# Patient Record
Sex: Male | Born: 1980 | Race: Black or African American | Hispanic: No | Marital: Single | Smoking: Current every day smoker
Health system: Southern US, Community
[De-identification: ages and names within clinical notes are randomized; demographics above are authoritative.]

## PROBLEM LIST (undated history)

## (undated) DIAGNOSIS — Y249XXA Unspecified firearm discharge, undetermined intent, initial encounter: Secondary | ICD-10-CM

## (undated) DIAGNOSIS — R040 Epistaxis: Secondary | ICD-10-CM

## (undated) HISTORY — PX: SHOULDER SURGERY: SHX246

## (undated) HISTORY — PX: HERNIA REPAIR: SHX51

---

## 2001-06-30 ENCOUNTER — Inpatient Hospital Stay (HOSPITAL_COMMUNITY): Admission: EM | Admit: 2001-06-30 | Discharge: 2001-07-01 | Payer: Self-pay | Admitting: Emergency Medicine

## 2002-12-17 ENCOUNTER — Emergency Department (HOSPITAL_COMMUNITY): Admission: EM | Admit: 2002-12-17 | Discharge: 2002-12-17 | Payer: Self-pay | Admitting: Emergency Medicine

## 2002-12-17 ENCOUNTER — Encounter: Payer: Self-pay | Admitting: Emergency Medicine

## 2005-12-03 ENCOUNTER — Emergency Department (HOSPITAL_COMMUNITY): Admission: EM | Admit: 2005-12-03 | Discharge: 2005-12-03 | Payer: Self-pay | Admitting: Emergency Medicine

## 2005-12-11 ENCOUNTER — Emergency Department (HOSPITAL_COMMUNITY): Admission: EM | Admit: 2005-12-11 | Discharge: 2005-12-11 | Payer: Self-pay | Admitting: Emergency Medicine

## 2008-03-14 ENCOUNTER — Emergency Department (HOSPITAL_COMMUNITY): Admission: EM | Admit: 2008-03-14 | Discharge: 2008-03-14 | Payer: Self-pay | Admitting: Emergency Medicine

## 2009-07-11 ENCOUNTER — Emergency Department (HOSPITAL_COMMUNITY): Admission: EM | Admit: 2009-07-11 | Discharge: 2009-07-11 | Payer: Self-pay | Admitting: Emergency Medicine

## 2009-08-10 ENCOUNTER — Emergency Department (HOSPITAL_COMMUNITY): Admission: EM | Admit: 2009-08-10 | Discharge: 2009-08-10 | Payer: Self-pay | Admitting: Emergency Medicine

## 2010-09-03 ENCOUNTER — Emergency Department (HOSPITAL_COMMUNITY)
Admission: EM | Admit: 2010-09-03 | Discharge: 2010-09-03 | Disposition: A | Discharge status: Home / Self Care | Payer: Self-pay | Attending: Emergency Medicine | Admitting: Emergency Medicine

## 2010-09-03 DIAGNOSIS — L298 Other pruritus: Secondary | ICD-10-CM | POA: Insufficient documentation

## 2010-09-03 DIAGNOSIS — B86 Scabies: Secondary | ICD-10-CM | POA: Insufficient documentation

## 2010-09-03 DIAGNOSIS — L2989 Other pruritus: Secondary | ICD-10-CM | POA: Insufficient documentation

## 2010-10-17 LAB — GC/CHLAMYDIA PROBE AMP, GENITAL
Chlamydia, DNA Probe: POSITIVE — AB
GC Probe Amp, Genital: NEGATIVE

## 2010-10-17 LAB — URINALYSIS, ROUTINE W REFLEX MICROSCOPIC
Hgb urine dipstick: NEGATIVE
Nitrite: NEGATIVE
Protein, ur: NEGATIVE mg/dL
Specific Gravity, Urine: 1.017 (ref 1.005–1.030)
Urobilinogen, UA: 0.2 mg/dL (ref 0.0–1.0)

## 2010-10-17 LAB — URINE MICROSCOPIC-ADD ON

## 2010-10-17 LAB — RPR: RPR Ser Ql: NONREACTIVE

## 2010-12-17 NOTE — Op Note (Signed)
[facility]  Patient:    MCKOY, BHAKTA Visit Number: [id number] MRN: [id number]          Service Type: MED Location: 1E 0102 01 Attending Physician:  Benny Lennert Dictated by:   Zigmund Daniel, M.D. Proc. Date: 06/30/01 Admit Date:  06/30/2001                             Operative Report  PREOPERATIVE DIAGNOSIS:  Incarcerated indirect left inguinal hernia.  POSTOPERATIVE DIAGNOSIS:  Incarcerated indirect left inguinal hernia.  OPERATION:  Repair of left inguinal hernia.  SURGEON:  Zigmund Daniel, M.D.  ANESTHESIA:  General.  DESCRIPTION OF PROCEDURE: After the patient was monitored and anesthetized and had routine preparation and draping of the left inguinal region, I made a short oblique incision beginning just at the pubic tubercle and then dissected down through the subcutaneous tissues until I encountered the external oblique.  I opened the external oblique into the external ring exposing the spermatic cord and taking care to avoid injury to the ilioinguinal and iliohypogastric nerves.  I controlled the spermatic cord and noted that there was no direct inguinal defect.  I longitudinally opened the spermatic cord and found a large hernia sac which contained a tiny bit of slightly bloody fluid but no visceral contents at the time of examination.  I opened the sac and made attempts to pull down the previously incarcerated intestine or other contents but he had a very small internal ring, and I could not get any intestine to come out easily so I abandoned that since the patient had no vomiting, no abdominal distention and normal white count.  I transected the sac and freed it up to the level of the internal ring and suture ligated it with 2-0 silk and amputated a portion of the sac.  I then plugged the superior portion of the internal ring with a small plug of polypropylene mesh held in place with a 2-0 silk stitch.  When I placed the  stitch inferiorly, there was some bleeding which appeared to be arterial so I removed the stitch and controlled it with pressure.  I had good hemostasis after about five minutes of pressure.  I then replaced the stitch more superficially and bleeding did not recur.  I fashioned a patch of Marlex mesh to fit the inguinal floor from the pubic tubercle superiorly and medially along the internal oblique and laterally and inferiorly along the shelving edge of the inguinal ligament with a slit cut in it to allow egress of the spermatic cord.  I sewed that in with 2-0 Prolene and joined the split tails of the mesh with the single stitch lateral to the spermatic cord.  I felt that the hernia repair was secure.  I thoroughly anesthetized the incision with 0.25% Marcaine with epinephrine.  I closed the external oblique and subcutaneous tissues with running 3-0 Vicryl and closed the skin with intracuticular 4-0 Vicryl and Steri-Strips.  The patient was stable through the procedure and went to the recovery room after application of bandages. Dictated by:   Zigmund Daniel, M.D. Attending Physician:  Benny Lennert DD:  06/30/01 TD:  06/30/01 Job: [id number]

## 2011-10-10 ENCOUNTER — Encounter (HOSPITAL_COMMUNITY): Payer: Self-pay

## 2011-10-10 ENCOUNTER — Emergency Department (HOSPITAL_COMMUNITY)
Admission: EM | Admit: 2011-10-10 | Discharge: 2011-10-10 | Disposition: A | Discharge status: Home / Self Care | Payer: Self-pay | Attending: Emergency Medicine | Admitting: Emergency Medicine

## 2011-10-10 DIAGNOSIS — F17200 Nicotine dependence, unspecified, uncomplicated: Secondary | ICD-10-CM | POA: Insufficient documentation

## 2011-10-10 DIAGNOSIS — K529 Noninfective gastroenteritis and colitis, unspecified: Secondary | ICD-10-CM

## 2011-10-10 DIAGNOSIS — K5289 Other specified noninfective gastroenteritis and colitis: Secondary | ICD-10-CM | POA: Insufficient documentation

## 2011-10-10 MED ORDER — DICYCLOMINE HCL 20 MG PO TABS: 20.0000 mg | ORAL_TABLET | Freq: Four times a day (QID) | ORAL | Status: DC | PRN

## 2011-10-10 MED ORDER — ONDANSETRON HCL 4 MG PO TABS: 4.0000 mg | ORAL_TABLET | Freq: Four times a day (QID) | ORAL | Status: AC | PRN

## 2011-10-10 NOTE — ED Notes (Signed)
Pt. Ate shellfish last night and became sick, having diarrhea nausea.  Pt. Stated that every time he eats shell fish he becomes ill

## 2011-10-10 NOTE — ED Provider Notes (Signed)
History     CSN: [id number]  Arrival date & time 10/10/11  4098   First MD Initiated Contact with Patient 10/10/11 0915      Chief Complaint  Patient presents with  . Diarrhea    (Consider location/radiation/quality/duration/timing/severity/associated sxs/prior treatment) The history is provided by the patient.   the patient is an otherwise healthy 31 year old male who presents to the emergency department with the chief complaint of watery diarrhea that began last night not long after he ate a dinner with shellfish. Diarrhea is watery and nonbloody. There's been associated mild to moderate intensity lower abdominal cramping as well as nausea with one episode of vomiting of undigested food this morning. There is no fever, chills, chest pain, shortness of breath, weakness, lightheadedness. Nothing makes the symptoms better or worse. There's been no prior treatment. He does report a similar illness last time he ate fish and at that time was told that he had "food poisoning".  History reviewed. No pertinent past medical history.  History reviewed. No pertinent past surgical history.  No family history on file.  History  Substance Use Topics  . Smoking status: Current Everyday Smoker  . Smokeless tobacco: Not on file  . Alcohol Use: Yes     occassional      Review of Systems 10 systems reviewed and are negative for acute change except as noted in the HPI.  Allergies  Review of patient's allergies indicates no known allergies.  Home Medications  No current outpatient prescriptions on file.  BP 154/81  Temp 98.1 F (36.7 C)  Resp 20  Ht 6\' 1"  (1.854 m)  Wt 225 lb (102.059 kg)  BMI 29.69 kg/m2  SpO2 100%  Physical Exam  Nursing note and vitals reviewed. Constitutional: He is oriented to person, place, and time. He appears well-developed and well-nourished.       Uncomfortable appearing, holding stomach, requesting to use the bathroom  HENT:  Head: Normocephalic and  atraumatic.  Right Ear: External ear normal.  Left Ear: External ear normal.       Mucous membranes moist  Eyes: Conjunctivae are normal. Pupils are equal, round, and reactive to light.  Neck: Normal range of motion. Neck supple.  Cardiovascular: Normal rate, regular rhythm, normal heart sounds and intact distal pulses.   Pulmonary/Chest: Effort normal and breath sounds normal. No respiratory distress. He has no wheezes. He exhibits no tenderness.  Abdominal: Soft. Bowel sounds are normal. He exhibits no distension. There is Tenderness: mild generalized abdominal tenderness.. There is no rebound and no guarding.       No rigidity  Musculoskeletal: He exhibits no edema and no tenderness.  Neurological: He is alert and oriented to person, place, and time. No cranial nerve deficit.  Skin: Skin is warm and dry.  Psychiatric: He has a normal mood and affect.    ED Course  Procedures (including critical care time)  Labs Reviewed - No data to display No results found.   Dx 1: Gastroenteritis   MDM  Gastroenteritis symptoms and an otherwise healthy male. As his symptoms have been occurring for approximately 12 hours, I do not suspect electrolyte disturbance. VSS, membranes moist. There is no fever and the abdomen is benign on examination, I do not suspect any emergent cause of his pain. He'll be given prescriptions for Bentyl and Zofran and has been advised to allow the illness to run its course. Return precautions were discussed.        Shaaron Adler, PA-C 10/10/11  0947 

## 2011-10-10 NOTE — Discharge Instructions (Signed)
Diarrhea Infections caused by germs (bacterial) or a virus commonly cause diarrhea. Your caregiver has determined that with time, rest and fluids, the diarrhea should improve. In general, eat normally while drinking more water than usual. Although water may prevent dehydration, it does not contain salt and minerals (electrolytes). Broths, weak tea without caffeine and oral rehydration solutions (ORS) replace fluids and electrolytes. Small amounts of fluids should be taken frequently. Large amounts at one time may not be tolerated. Plain water may be harmful in infants and the elderly. Oral rehydrating solutions (ORS) are available at pharmacies and grocery stores. ORS replace water and important electrolytes in proper proportions. Sports drinks are not as effective as ORS and may be harmful due to sugars worsening diarrhea.  ORS is especially recommended for use in children with diarrhea. As a general guideline for children, replace any new fluid losses from diarrhea and/or vomiting with ORS as follows:   If your child weighs 22 pounds or under (10 kg or less), give 60-120 mL ( -  cup or 2 - 4 ounces) of ORS for each episode of diarrheal stool or vomiting episode.   If your child weighs more than 22 pounds (more than 10 kgs), give 120-240 mL ( - 1 cup or 4 - 8 ounces) of ORS for each diarrheal stool or episode of vomiting.   While correcting for dehydration, children should eat normally. However, foods high in sugar should be avoided because this may worsen diarrhea. Large amounts of carbonated soft drinks, juice, gelatin desserts and other highly sugared drinks should be avoided.   After correction of dehydration, other liquids that are appealing to the child may be added. Children should drink small amounts of fluids frequently and fluids should be increased as tolerated. Children should drink enough fluids to keep urine clear or pale yellow.   Adults should eat normally while drinking more fluids  than usual. Drink small amounts of fluids frequently and increase as tolerated. Drink enough fluids to keep urine clear or pale yellow. Broths, weak decaffeinated tea, lemon lime soft drinks (allowed to go flat) and ORS replace fluids and electrolytes.   Avoid:   Carbonated drinks.   Juice.   Extremely hot or cold fluids.   Caffeine drinks.   Fatty, greasy foods.   Alcohol.   Tobacco.   Too much intake of anything at one time.   Gelatin desserts.   Probiotics are active cultures of beneficial bacteria. They may lessen the amount and number of diarrheal stools in adults. Probiotics can be found in yogurt with active cultures and in supplements.   Wash hands well to avoid spreading bacteria and virus.   Anti-diarrheal medications are not recommended for infants and children.   Only take over-the-counter or prescription medicines for pain, discomfort or fever as directed by your caregiver. Do not give aspirin to children because it may cause Reye's Syndrome.   For adults, ask your caregiver if you should continue all prescribed and over-the-counter medicines.   If your caregiver has given you a follow-up appointment, it is very important to keep that appointment. Not keeping the appointment could result in a chronic or permanent injury, and disability. If there is any problem keeping the appointment, you must call back to this facility for assistance.  SEEK IMMEDIATE MEDICAL CARE IF:   You or your child is unable to keep fluids down or other symptoms or problems become worse in spite of treatment.   Vomiting or diarrhea develops and becomes persistent.     There is vomiting of blood or bile (green material).   There is blood in the stool or the stools are black and tarry.   There is no urine output in 6-8 hours or there is only a small amount of very dark urine.   Abdominal pain develops, increases or localizes.   You have a fever.   Your baby is older than 3 months with a  rectal temperature of 102 F (38.9 C) or higher.   Your baby is 73 months old or younger with a rectal temperature of 100.4 F (38 C) or higher.   You or your child develops excessive weakness, dizziness, fainting or extreme thirst.   You or your child develops a rash, stiff neck, severe headache or become irritable or sleepy and difficult to awaken.  MAKE SURE YOU:   Understand these instructions.   Will watch your condition.   Will get help right away if you are not doing well or get worse.  Document Released: 07/08/2002 Document Revised: 07/07/2011 Document Reviewed: 05/25/2009 [facility] Patient Information 2012 [place], [place].             Diet for Diarrhea, Adult Having frequent, runny stools (diarrhea) has many causes. Diarrhea may be caused or worsened by food or drink. Diarrhea may be relieved by changing your diet. IF YOU ARE NOT TOLERATING SOLID FOODS:  Drink enough water and fluids to keep your urine clear or pale yellow.   Avoid sugary drinks and sodas as well as milk-based beverages.   Avoid beverages containing caffeine and alcohol.   You may try rehydrating beverages. You can make your own by following this recipe:    tsp table salt.    tsp baking soda.   ? tsp salt substitute (potassium chloride).   1 tbs + 1 tsp sugar.   1 qt water.  As your stools become more solid, you can start eating solid foods. Add foods one at a time. If a certain food causes your diarrhea to get worse, avoid that food and try other foods. A low fiber, low-fat, and lactose-free diet is recommended. Small, frequent meals may be better tolerated.  Starches  Allowed:  White, [place], and pita breads, plain rolls, buns, bagels. Plain muffins, matzo. Soda, saltine, or graham crackers. Pretzels, melba toast, zwieback. Cooked cereals made with water: cornmeal, farina, cream cereals. Dry cereals: refined corn, wheat, rice. Potatoes prepared any way without skins, refined macaroni,  spaghetti, noodles, refined rice.   Avoid:  Bread, rolls, or crackers made with whole wheat, multi-grains, rye, bran seeds, nuts, or coconut. Corn tortillas or taco shells. Cereals containing whole grains, multi-grains, bran, coconut, nuts, or raisins. Cooked or dry oatmeal. Coarse wheat cereals, granola. Cereals advertised as "high-fiber." Potato skins. Whole grain pasta, wild or brown rice. Popcorn. Sweet potatoes/yams. Sweet rolls, doughnuts, waffles, pancakes, sweet breads.  Vegetables  Allowed: Strained tomato and vegetable juices. Most well-cooked and canned vegetables without seeds. Fresh: Tender lettuce, cucumber without the skin, cabbage, spinach, bean sprouts.   Avoid: Fresh, cooked, or canned: Artichokes, baked beans, beet greens, broccoli, Brussels sprouts, corn, kale, legumes, peas, sweet potatoes. Cooked: Green or red cabbage, spinach. Avoid large servings of any vegetables, because vegetables shrink when cooked, and they contain more fiber per serving than fresh vegetables.  Fruit  Allowed: All fruit juices except prune juice. Cooked or canned: Apricots, applesauce, cantaloupe, cherries, fruit cocktail, grapefruit, grapes, kiwi, mandarin oranges, peaches, pears, plums, watermelon. Fresh: Apples without skin, ripe banana, grapes, cantaloupe, cherries, grapefruit,  peaches, oranges, plums. Keep servings limited to  cup or 1 piece.   Avoid: Fresh: Apple with skin, apricots, mango, pears, raspberries, strawberries. Prune juice, stewed or dried prunes. Dried fruits, raisins, dates. Large servings of all fresh fruits.  Meat and Meat Substitutes  Allowed: Ground or well-cooked tender beef, ham, veal, lamb, pork, or poultry. Eggs, plain cheese. Fish, oysters, shrimp, lobster, other seafoods. Liver, organ meats.   Avoid: Tough, fibrous meats with gristle. Peanut butter, smooth or chunky. Cheese, nuts, seeds, legumes, dried peas, beans, lentils.  Milk  Allowed: Yogurt, lactose-free milk,  kefir, drinkable yogurt, buttermilk, soy milk.   Avoid: Milk, chocolate milk, beverages made with milk, such as milk shakes.  Soups  Allowed: Bouillon, broth, or soups made from allowed foods. Any strained soup.   Avoid: Soups made from vegetables that are not allowed, cream or milk-based soups.  Desserts and Sweets  Allowed: Sugar-free gelatin, sugar-free frozen ice pops made without sugar alcohol.   Avoid: Plain cakes and cookies, pie made with allowed fruit, pudding, custard, cream pie. Gelatin, fruit, ice, sherbet, frozen ice pops. Ice cream, ice milk without nuts. Plain hard candy, honey, jelly, molasses, syrup, sugar, chocolate syrup, gumdrops, marshmallows.  Fats and Oils  Allowed: Avoid any fats and oils.   Avoid: Seeds, nuts, olives, avocados. Margarine, butter, cream, mayonnaise, salad oils, plain salad dressings made from allowed foods. Plain gravy, crisp bacon without rind.  Beverages  Allowed: Water, decaffeinated teas, oral rehydration solutions, sugar-free beverages.   Avoid: Fruit juices, caffeinated beverages (coffee, tea, soda or pop), alcohol, sports drinks, or lemon-lime soda or pop.  Condiments  Allowed: Ketchup, mustard, horseradish, vinegar, cream sauce, cheese sauce, cocoa powder. Spices in moderation: allspice, basil, bay leaves, celery powder or leaves, cinnamon, cumin powder, curry powder, ginger, mace, marjoram, onion or garlic powder, oregano, paprika, parsley flakes, ground pepper, rosemary, sage, savory, tarragon, thyme, turmeric.   Avoid: Coconut, honey.  Weight Monitoring: Weigh yourself every day. You should weigh yourself in the morning after you urinate and before you eat breakfast. Wear the same amount of clothing when you weigh yourself. Record your weight daily. Bring your recorded weights to your clinic visits. Tell your caregiver right away if you have gained 3 lb/1.4 kg or more in 1 day, 5 lb/2.3 kg in a week, or whatever amount you were told to  report. SEEK IMMEDIATE MEDICAL CARE IF:   You are unable to keep fluids down.   You start to throw up (vomit) or diarrhea keeps coming back (persistent).   Abdominal pain develops, increases, or can be felt in one place (localizes).   You have an oral temperature above 102 F (38.9 C), not controlled by medicine.   Diarrhea contains blood or mucus.   You develop excessive weakness, dizziness, fainting, or extreme thirst.  MAKE SURE YOU:   Understand these instructions.   Will watch your condition.   Will get help right away if you are not doing well or get worse.  Document Released: 10/08/2003 Document Revised: 07/07/2011 Document Reviewed: 01/29/2009 [facility] Patient Information 2012 [place], [place].          If you have no primary doctor, here are some resources that may be helpful:  Medicaid-accepting [facility] Providers:   - [facility]- [address]      [phone]      Mon-Fri 9am-7pm, Sat 9am-1pm   - [facility]- [address] [place], [address] [place]  [phone]   [facility]- [address], [address] [place]      [phone]   [facility]- [address]      [phone]   - Renaye Rakers- [address] [place], [address]      [phone]      Only accepts [place] patients       after they have her name applied to their card   Self Pay (no insurance) in [place]:   - Sickle Cell Patients: Dr Willey Blade, [facility]      [address] [place]      [phone]   - [phone]   - Physician Referral Service- [phone]   - [facility]- [address] [place]      [phone]   [facility]- [address]   - [facility]- see information above      (Speak to [employer] if you do not have insurance)   - [facility]- [address] [place]      [phone]   - [facility] [place]- [address]      [phone]   - [facility]- [address]      [phone]   - Dr Julio Sicks-  [address], [place]      [phone]   - [facility]- [address]      [phone]   - [facility]- [address]      [phone]      Also [address]      [phone]   - [facility]- [address]      [phone]      1st and 3rd Saturday every month, 10am-1pm Other agencies that provide inexpensive medical care:    [facility]  [phone]    [facility]  [phone]    [facility]  [phone]    [facility]  [phone]    [facility]  [phone]  General Information: Finding a doctor when you do not have health insurance can be tricky. Although you are not limited by an insurance plan, you are of course limited by her finances and how much but he can pay out of pocket.  What are your options if you don't have health insurance?   1) Find a Librarian, academic and Pay Out of Pocket Although you won't have to find out who is covered by your insurance plan, it is a good idea to ask around and get recommendations. You will then need to call the office and see if the doctor you have chosen will accept you as a new patient and what types of options they offer for patients who are self-pay. Some doctors offer discounts or will set up payment plans for their patients who do not have insurance, but you will need to ask so you aren't surprised when you get to your appointment.  2) Contact Your Local Health Department Not all health departments have doctors that can see patients for sick visits, but many do, so it is worth a call to see if yours does. If you don't know where your local health department is, you can check in your phone book. The CDC also has a tool to help you locate your state's health department,  and many state websites also have listings of all of their local  health departments.  3) Find a Walk-in Clinic If your illness is not likely to be very severe or complicated, you may want to try a walk in clinic. These are popping up all over the country in pharmacies, drugstores, and shopping centers. They're usually staffed by nurse practitioners or physician assistants that have been trained to treat common illnesses and complaints. They're usually fairly quick and inexpensive. However, if you have serious medical issues or chronic medical problems, these are probably not your best option

## 2011-10-10 NOTE — ED Provider Notes (Signed)
Medical screening examination/treatment/procedure(s) were performed by non-physician practitioner and as supervising physician I was immediately available for consultation/collaboration.   Xitlali Kastens, MD 10/10/11 1536 

## 2012-01-19 ENCOUNTER — Emergency Department (HOSPITAL_COMMUNITY)
Admission: EM | Admit: 2012-01-19 | Discharge: 2012-01-19 | Disposition: A | Discharge status: Home / Self Care | Payer: Self-pay | Attending: Emergency Medicine | Admitting: Emergency Medicine

## 2012-01-19 ENCOUNTER — Encounter (HOSPITAL_COMMUNITY): Payer: Self-pay | Admitting: *Deleted

## 2012-01-19 DIAGNOSIS — Y990 Civilian activity done for income or pay: Secondary | ICD-10-CM | POA: Insufficient documentation

## 2012-01-19 DIAGNOSIS — Z87891 Personal history of nicotine dependence: Secondary | ICD-10-CM | POA: Insufficient documentation

## 2012-01-19 DIAGNOSIS — M6282 Rhabdomyolysis: Secondary | ICD-10-CM | POA: Insufficient documentation

## 2012-01-19 DIAGNOSIS — T672XXA Heat cramp, initial encounter: Secondary | ICD-10-CM | POA: Insufficient documentation

## 2012-01-19 DIAGNOSIS — X30XXXA Exposure to excessive natural heat, initial encounter: Secondary | ICD-10-CM | POA: Insufficient documentation

## 2012-01-19 LAB — CBC
Hemoglobin: 15.2 g/dL (ref 13.0–17.0)
MCH: 31.6 pg (ref 26.0–34.0)
Platelets: 214 10*3/uL (ref 150–400)
RBC: 4.81 MIL/uL (ref 4.22–5.81)

## 2012-01-19 LAB — POCT I-STAT, CHEM 8
BUN: 21 mg/dL (ref 6–23)
Creatinine, Ser: 1.1 mg/dL (ref 0.50–1.35)
Hemoglobin: 17.3 g/dL — ABNORMAL HIGH (ref 13.0–17.0)
Potassium: 3.9 mEq/L (ref 3.5–5.1)
Sodium: 139 mEq/L (ref 135–145)
TCO2: 24 mmol/L (ref 0–100)

## 2012-01-19 LAB — POCT I-STAT TROPONIN I: Troponin i, poc: 0 ng/mL (ref 0.00–0.08)

## 2012-01-19 LAB — CK: Total CK: 818 U/L — ABNORMAL HIGH (ref 7–232)

## 2012-01-19 MED ORDER — SODIUM CHLORIDE 0.9 % IV BOLUS (SEPSIS)
1000.0000 mL | Freq: Once | INTRAVENOUS | Status: AC
  Administered 2012-01-19: 1000 mL via INTRAVENOUS

## 2012-01-19 NOTE — ED Provider Notes (Signed)
History     CSN: [id number]  Arrival date & time 01/19/12  1021   First MD Initiated Contact with Patient 01/19/12 1030      Chief Complaint  Patient presents with  . Heat Exposure    (Consider location/radiation/quality/duration/timing/severity/associated sxs/prior treatment) HPI  Patient presents to emergency department by EMS from his workplace complaining of muscle cramping due to heat exposure. 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 Patient states that he was trying to catch up on productivity and therefore did not take breaks to rest. 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 Patient states that by the time he EMS arrived and got him out of the factory and into the ambulance the symptoms had resolved. Patient states "I did want to come to the ER but the people at work were just following protocol." He denies any associated dizziness, chest pain, shortness of breath, nausea, vomiting, or difficulty in relating. Symptoms are gradual onset, and resolved. Patient took nothing for symptoms PTA.  History reviewed. No pertinent past medical history.  History reviewed. No pertinent past surgical history.  History reviewed. No pertinent family history.  History  Substance Use Topics  . Smoking status: Former Games developer  . Smokeless tobacco: Not on file  . Alcohol Use: No     occassional      Review of Systems  All other systems reviewed and are negative.    Allergies  Review of patient's allergies indicates no known allergies.  Home Medications  No current outpatient prescriptions on file.  BP 141/83  Pulse 79  Temp 98.3 F (36.8 C)  Resp 20  SpO2 97%  Physical Exam  Nursing note and vitals reviewed. Constitutional: He is oriented to  person, place, and time. He appears well-developed and well-nourished. No distress.  HENT:  Head: Normocephalic and atraumatic.  Eyes: Conjunctivae are normal.  Neck: Normal range of motion. Neck supple.  Cardiovascular: Normal rate, regular rhythm, normal heart sounds and intact distal pulses.  Exam reveals no gallop and no friction rub.   No murmur heard. Pulmonary/Chest: Effort normal and breath sounds normal. No respiratory distress. He has no wheezes. He has no rales. He exhibits no tenderness.  Abdominal: Soft. Bowel sounds are normal. He exhibits no distension and no mass. There is no tenderness. There is no rebound and no guarding.  Musculoskeletal: Normal range of motion. He exhibits no edema and no tenderness.  Neurological: He is alert and oriented to person, place, and time.  Skin: Skin is warm and dry. No rash noted. He is not diaphoretic. No erythema.  Psychiatric: He has a normal mood and affect.    ED Course  Procedures (including critical care time)  IV fluids.   Labs Reviewed  CK - Abnormal; Notable for the following:    Total CK 818 (*)     All other components within normal limits  POCT I-STAT, CHEM 8 - Abnormal; Notable for the following:    Hemoglobin 17.3 (*)     All other components within normal limits  CBC   No results found.   1. Rhabdomyolysis       MDM  Patient has evidence of some mild rhabdomyolysis however he is young and healthy with normal kidney function. His  symptoms had resolved by time of arrival to the ER and has reamined at baseline throughout ER stay. His eating and drinking without difficulty denies any nausea, vomiting, headache, or dizziness throughout course of events. He is ambulating without difficulty. I spoke at length with patient about the importance of staying well hydrated whenever he is in the hot environment and doing strenuous activity however that he needs to have a 72 hour break from any strenuous activity. Patient voices  his understanding and is agreeable plan.        [place], [place] 01/19/12 1156

## 2012-01-19 NOTE — ED Notes (Signed)
Meal tray provided to patient.

## 2012-01-19 NOTE — ED Notes (Signed)
Discharge instruction to patient.  No questions or concerns at discharge

## 2012-01-19 NOTE — ED Notes (Signed)
Patient works in [employer] that he states "was 105 degrees".  He stated having cramps in arms/legs and EMS called for transport.

## 2012-01-19 NOTE — ED Provider Notes (Signed)
Medical screening examination/treatment/procedure(s) were performed by non-physician practitioner and as supervising physician I was immediately available for consultation/collaboration.   Jahnasia Tatum B. Bernette Mayers, MD 01/19/12 1200

## 2012-01-19 NOTE — ED Notes (Signed)
Patient states on arrival "he is better, no pain, no cramping of muscles".

## 2012-01-19 NOTE — Discharge Instructions (Signed)
Stay very well hydrated for the next few days avoiding working out or strenuous activity for the next 3 days. Return to ER for any changing or worsening of symptoms.   Rhabdomyolysis Rhabdomyolysis is the breakdown of muscle fibers due to injury. The injury may come from physical damage to the muscle like an injury but other causes are:  High fever (hyperthermia).   Seizures (convulsions).   Low phosphate levels.   Diseases of metabolism.   Heatstroke.   Drug toxicity.   Over exertion.   Alcoholism.   Muscle is cut off from oxygen (anoxia).   The squeezing of nerves and blood vessels (compartment syndrome).  Some drugs which may cause the breakdown of muscle are:  Antibiotics.   Statins.   Alcohol.   Animal toxins.  Myoglobin is a substance which helps muscle use oxygen. When the muscle is damaged, the myoglobin is released into the bloodstream. It is filtered out of the bloodstream by the kidneys. Myoglobin may block up the kidneys. This may cause damage, such as kidney failure. It also breaks down into other damaging toxic parts, which also cause kidney failure.  SYMPTOMS   Dark, red, or tea colored urine.   Weakness of affected muscles.   Weight gain from water retention.   Joint aches and pains.   Irregular heart from high potassium in the blood.   Muscle tenderness or aching.   Generalized weakness.   Seizures.   Feeling tired (fatigue).  DIAGNOSIS  Your caregiver may find muscle tenderness on exam and suspect the problem. Urine tests and blood work can confirm the problem. TREATMENT   Early and aggressive treatment with large amounts of fluids may help prevent kidney failure.   Water producing medicine (diuretic) may be used to help flush the kidneys.   High potassium and calcium problems (electrolyte) in your blood may need treatment.  HOME CARE INSTRUCTIONS  This problem is usually cared for in a hospital. If you are allowed to go home and  require dialysis, make sure you keep all appointments for lab work and dialysis. Not doing so could result in death. Document Released: 2004/07/02 Document Revised: 07/07/2011 Document Reviewed: 01/12/2009 [facility] Patient Information 2012 [place], [place].

## 2012-02-14 ENCOUNTER — Encounter (HOSPITAL_COMMUNITY): Payer: Self-pay | Admitting: *Deleted

## 2012-02-14 ENCOUNTER — Emergency Department (HOSPITAL_COMMUNITY): Payer: Self-pay

## 2012-02-14 ENCOUNTER — Emergency Department (HOSPITAL_COMMUNITY)
Admission: EM | Admit: 2012-02-14 | Discharge: 2012-02-14 | Disposition: A | Discharge status: Home / Self Care | Payer: Self-pay | Attending: Emergency Medicine | Admitting: Emergency Medicine

## 2012-02-14 DIAGNOSIS — M25579 Pain in unspecified ankle and joints of unspecified foot: Secondary | ICD-10-CM | POA: Insufficient documentation

## 2012-02-14 DIAGNOSIS — F17200 Nicotine dependence, unspecified, uncomplicated: Secondary | ICD-10-CM | POA: Insufficient documentation

## 2012-02-14 DIAGNOSIS — M79609 Pain in unspecified limb: Secondary | ICD-10-CM | POA: Insufficient documentation

## 2012-02-14 DIAGNOSIS — IMO0002 Reserved for concepts with insufficient information to code with codable children: Secondary | ICD-10-CM | POA: Insufficient documentation

## 2012-02-14 DIAGNOSIS — Z23 Encounter for immunization: Secondary | ICD-10-CM | POA: Insufficient documentation

## 2012-02-14 MED ORDER — TETANUS-DIPHTH-ACELL PERTUSSIS 5-2.5-18.5 LF-MCG/0.5 IM SUSP
0.5000 mL | Freq: Once | INTRAMUSCULAR | Status: AC
  Administered 2012-02-14: 0.5 mL via INTRAMUSCULAR
  Filled 2012-02-14 (×2): qty 0.5

## 2012-02-14 MED ORDER — IBUPROFEN 800 MG PO TABS
800.0000 mg | ORAL_TABLET | Freq: Once | ORAL | Status: AC
  Administered 2012-02-14: 800 mg via ORAL
  Filled 2012-02-14: qty 1
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
## 2012-02-14 NOTE — ED Notes (Signed)
Patient transported to X-ray 

## 2012-02-14 NOTE — ED Notes (Addendum)
C/o R ankle pain, reports injury yesterday morning when a ~ 500lb cart hit achilles tendon area of R ankle (at work). Denies sx other than pain. Ambulatory with limp.

## 2012-02-14 NOTE — Progress Notes (Signed)
Orthopedic Tech Progress Note Patient Details:  Max Gutierrez 1980/12/14 [id number]  Ortho Devices Type of Ortho Device: ASO Ortho Device/Splint Location: right LE Ortho Device/Splint Interventions: Application   Dellie Piasecki T 02/14/2012, 8:26 AM

## 2012-02-14 NOTE — ED Provider Notes (Signed)
Medical screening examination/treatment/procedure(s) were performed by non-physician practitioner and as supervising physician I was immediately available for consultation/collaboration.   Clair Bardwell, MD 02/14/12 1605 

## 2013-03-27 ENCOUNTER — Encounter (HOSPITAL_COMMUNITY): Payer: Self-pay | Admitting: *Deleted

## 2013-03-27 ENCOUNTER — Emergency Department (HOSPITAL_COMMUNITY)
Admission: EM | Admit: 2013-03-27 | Discharge: 2013-03-27 | Disposition: A | Discharge status: Home / Self Care | Payer: Self-pay | Attending: Emergency Medicine | Admitting: Emergency Medicine

## 2013-03-27 DIAGNOSIS — R112 Nausea with vomiting, unspecified: Secondary | ICD-10-CM | POA: Insufficient documentation

## 2013-03-27 DIAGNOSIS — R197 Diarrhea, unspecified: Secondary | ICD-10-CM | POA: Insufficient documentation

## 2013-03-27 DIAGNOSIS — R1110 Vomiting, unspecified: Secondary | ICD-10-CM

## 2013-03-27 DIAGNOSIS — F17200 Nicotine dependence, unspecified, uncomplicated: Secondary | ICD-10-CM | POA: Insufficient documentation

## 2013-03-27 LAB — COMPREHENSIVE METABOLIC PANEL
ALT: 36 U/L (ref 0–53)
AST: 32 U/L (ref 0–37)
Albumin: 4.4 g/dL (ref 3.5–5.2)
Alkaline Phosphatase: 60 U/L (ref 39–117)
CO2: 25 mEq/L (ref 19–32)
Chloride: 100 mEq/L (ref 96–112)
GFR calc non Af Amer: >90 mL/min (ref >90)
Potassium: 3.8 mEq/L (ref 3.5–5.1)
Sodium: 136 mEq/L (ref 135–145)
Total Bilirubin: 0.4 mg/dL (ref 0.3–1.2)

## 2013-03-27 LAB — CBC WITH DIFFERENTIAL/PLATELET
Basophils Absolute: 0 10*3/uL (ref 0.0–0.1)
Basophils Relative: 0 % (ref 0–1)
HCT: 42.1 % (ref 39.0–52.0)
Hemoglobin: 14.5 g/dL (ref 13.0–17.0)
Lymphocytes Relative: 28 % (ref 12–46)
MCHC: 34.4 g/dL (ref 30.0–36.0)
Neutro Abs: 4 10*3/uL (ref 1.7–7.7)
Neutrophils Relative %: 63 % (ref 43–77)
RDW: 12.8 % (ref 11.5–15.5)
WBC: 6.4 10*3/uL (ref 4.0–10.5)

## 2013-03-27 MED ORDER — ONDANSETRON HCL 4 MG PO TABS: 4.0000 mg | ORAL_TABLET | Freq: Four times a day (QID) | ORAL | Status: AC

## 2013-03-27 MED ORDER — DIPHENOXYLATE-ATROPINE 2.5-0.025 MG PO TABS: 1.0000 | ORAL_TABLET | Freq: Four times a day (QID) | ORAL | Status: AC | PRN
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
## 2013-03-27 NOTE — ED Notes (Signed)
Pt in c/o abd pain with n/v/d, pt points to epigastric area when describing pain, pt denies fever, pt states son was sick last week with similar symptoms

## 2014-12-18 ENCOUNTER — Emergency Department (HOSPITAL_COMMUNITY)
Admission: EM | Admit: 2014-12-18 | Discharge: 2014-12-18 | Disposition: A | Discharge status: Home / Self Care | Payer: Self-pay | Attending: Emergency Medicine | Admitting: Emergency Medicine

## 2014-12-18 ENCOUNTER — Encounter (HOSPITAL_COMMUNITY): Payer: Self-pay | Admitting: Emergency Medicine

## 2014-12-18 DIAGNOSIS — Z720 Tobacco use: Secondary | ICD-10-CM | POA: Insufficient documentation

## 2014-12-18 DIAGNOSIS — Z7721 Contact with and (suspected) exposure to potentially hazardous body fluids: Secondary | ICD-10-CM | POA: Insufficient documentation

## 2014-12-18 NOTE — ED Notes (Signed)
Pt was weed-eating a ditch, came in contact with a "bag of needles" which appeared "new" per pt. Has a small puncture wound to right lower leg. Pt states he is not concerned but his fiance' insisted he be checked.
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
## 2014-12-18 NOTE — Discharge Instructions (Signed)
Needle Stick Injury  A needle stick injury occurs when you are stuck by a needle that may have the blood from another person on it. Most of the time these injuries heal without any problem, but several diseases can be transmitted this way. You should be aware of the risks. A needle stick injury can cause risk for getting:  · Hepatitis B.  · Hepatitis C.  · HIV infection (the virus that causes AIDS).  The chance of getting one of these infections from a needle stick injury is small. However, it is important to take proper precautions to prevent such an injury. It is also important to understand and follow some health care recommendations when such an injury occurs.   RISK FACTORS  In general, the risk of infection after a needle stick injury appears to be higher with:  · Exposure to a needle that is visibly contaminated with blood.  · Exposure to the blood of a patient with an advanced disease or with a high viral load.  · A deep injury.  · Needle placement in a vein or artery.  PREVENTION   All health care workers should:  · Wash their hands often, including before and after caring for each patient.  · Receive the hepatitis B vaccine before any possible exposure to blood or bloody body fluids.  · Use personal protective equipment (PPE) when appropriate. This includes:  ¨ Gloves.  ¨ Gowns.  ¨ Boots.  ¨ Shoe covers.  ¨ Eyewear.  ¨ Masks.  · Wear gloves when any kind of venous or arterial access is being done.  · Use safety devices when available.  · Use sharp edges and needles with caution.  · Dispose of used needles and other sharps in puncture proof receptacles.  · Never recap needles.  TREATMENT   · After a needle stick injury, immediate cleansing with soap and water or an alcohol-based hand hygiene agent is needed.  · If you did not have a tetanus booster within the past 10 years, a booster shot should be given.  · If the puncture site becomes red, swollen, more painful, or drains yellowish-white fluid (pus),  medicine for a bacterial infection may be needed.  · If the blood on the needle is known or thought to be high risk for hepatitis B or HIV, additional treatment is needed.  · For needle stick exposures to the HIV virus, drug treatment is advised. This treatment is called post-exposure prophylaxis (PEP) and should be started as soon as possible following the injury. The recommended period of treatment with medicines is usually 4 weeks with 2 or more different drugs. You should have follow-up counseling and a medical evaluation, including HIV blood tests, right away. The tests should be repeated at 6 weeks, 3 months, and 6 months. Blood tests to monitor for drug toxicity effects of the PEP medicines are usually recommended immediately before treatment starts and again at 2 weeks and 4 weeks after the start of PEP. Additional recommendations during the first 6 to 12 weeks after exposure include:  ¨ Practicing sexual abstinence or using condoms to prevent sexual transmission and to avoid pregnancy.  ¨ Refraining from donating blood, plasma, semen, organs, or other tissue.  ¨ For breastfeeding women, considering temporary discontinuation of breastfeeding while on PEP.  · For needle stick exposures to hepatitis B, blood testing and PEP is also needed. If you have not been vaccinated against hepatitis B, this vaccine series should be started and hepatitis B immune   globulin should also be given. If you have been previously vaccinated, your status of immunity to infection with hepatitis B can be tested by a blood antibody test. Before or after those test results are available, repeat vaccination with or without hepatitis B immune globulin will be considered.  · Unfortunately, no helpful treatment following hepatitis C exposure has been identified or is recommended. Follow-up blood testing is advised over a period of 4 weeks to 6 months to determine if the needle stick led to an infection. Ask your caregiver for advice about  this follow-up testing.  HOME CARE INSTRUCTIONS  · Take medicine exactly as told by your caregiver.  · Keep all follow-up appointments.  · Do not share personal hygiene items.  SEEK IMMEDIATE MEDICAL CARE IF:  · You have concerns about your injury, treatment, or follow-up.  · The injury site becomes red, swollen, or painful.  · The injury site drains pus.  MAKE SURE YOU:  · Understand these instructions.  · Will watch your condition.  · Will get help right away if you are not doing well or get worse.  Document Released: 07/18/2005 Document Revised: 12/02/2013 Document Reviewed: 12/21/2010  [employer]® Patient Information ©2015 [employer]. This information is not intended to replace advice given to you by your health care provider. Make sure you discuss any questions you have with your health care provider.
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
## 2015-06-29 ENCOUNTER — Encounter (HOSPITAL_COMMUNITY): Payer: Self-pay | Admitting: Emergency Medicine

## 2015-06-29 ENCOUNTER — Emergency Department (HOSPITAL_COMMUNITY)
Admission: EM | Admit: 2015-06-29 | Discharge: 2015-06-29 | Disposition: A | Discharge status: Home / Self Care | Payer: Self-pay | Attending: Emergency Medicine | Admitting: Emergency Medicine

## 2015-06-29 DIAGNOSIS — R059 Cough, unspecified: Secondary | ICD-10-CM

## 2015-06-29 DIAGNOSIS — R1110 Vomiting, unspecified: Secondary | ICD-10-CM

## 2015-06-29 DIAGNOSIS — Z79899 Other long term (current) drug therapy: Secondary | ICD-10-CM | POA: Insufficient documentation

## 2015-06-29 DIAGNOSIS — F17210 Nicotine dependence, cigarettes, uncomplicated: Secondary | ICD-10-CM | POA: Insufficient documentation

## 2015-06-29 DIAGNOSIS — R112 Nausea with vomiting, unspecified: Secondary | ICD-10-CM | POA: Insufficient documentation

## 2015-06-29 DIAGNOSIS — R05 Cough: Secondary | ICD-10-CM | POA: Insufficient documentation

## 2015-06-29 DIAGNOSIS — R109 Unspecified abdominal pain: Secondary | ICD-10-CM | POA: Insufficient documentation

## 2015-06-29 HISTORY — DX: Epistaxis: R04.0

## 2015-06-29 MED ORDER — BENZONATATE 100 MG PO CAPS: 100.0000 mg | ORAL_CAPSULE | Freq: Three times a day (TID) | ORAL | Status: AC

## 2015-06-29 MED ORDER — BENZONATATE 100 MG PO CAPS
100.0000 mg | ORAL_CAPSULE | Freq: Once | ORAL | Status: AC
  Administered 2015-06-29: 100 mg via ORAL
  Filled 2015-06-29: qty 1

## 2015-06-29 NOTE — ED Notes (Signed)
Patient has cough that started yesterday.   Patient states he coughs so much it makes him vomit.   Patient states he has been "sweating".   Patient denies other symptoms.
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
## 2015-06-29 NOTE — Discharge Instructions (Signed)
1. Medications: tessalon (cough medicine), usual home medications 2. Treatment: rest, drink plenty of fluids 3. Follow Up: please followup with your primary doctor [facility]) for discussion of your diagnoses and further evaluation after today's visit; if you do not have a primary care doctor use the resource guide provided to find one; please return to the ER for high fever, severe abdominal pain, persistent vomiting   Cough, Adult Coughing is a reflex that clears your throat and your airways. Coughing helps to heal and protect your lungs. It is normal to cough occasionally, but a cough that happens with other symptoms or lasts a long time may be a sign of a condition that needs treatment. A cough may last only 2-3 weeks (acute), or it may last longer than 8 weeks (chronic). CAUSES Coughing is commonly caused by:  Breathing in substances that irritate your lungs.  A viral or bacterial respiratory infection.  Allergies.  Asthma.  Postnasal drip.  Smoking.  Acid backing up from the stomach into the esophagus (gastroesophageal reflux).  Certain medicines.  Chronic lung problems, including COPD (or rarely, lung cancer).  Other medical conditions such as heart failure. HOME CARE INSTRUCTIONS  Pay attention to any changes in your symptoms. Take these actions to help with your discomfort:  Take medicines only as told by your health care provider.  If you were prescribed an antibiotic medicine, take it as told by your health care provider. Do not stop taking the antibiotic even if you start to feel better.  Talk with your health care provider before you take a cough suppressant medicine.  Drink enough fluid to keep your urine clear or pale yellow.  If the air is dry, use a cold steam vaporizer or humidifier in your bedroom or your home to help loosen secretions.  Avoid anything that causes you to cough at work or at home.  If your cough is worse at night, try sleeping  in a semi-upright position.  Avoid cigarette smoke. If you smoke, quit smoking. If you need help quitting, ask your health care provider.  Avoid caffeine.  Avoid alcohol.  Rest as needed. SEEK MEDICAL CARE IF:   You have new symptoms.  You cough up pus.  Your cough does not get better after 2-3 weeks, or your cough gets worse.  You cannot control your cough with suppressant medicines and you are losing sleep.  You develop pain that is getting worse or pain that is not controlled with pain medicines.  You have a fever.  You have unexplained weight loss.  You have night sweats. SEEK IMMEDIATE MEDICAL CARE IF:  You cough up blood.  You have difficulty breathing.  Your heartbeat is very fast.   This information is not intended to replace advice given to you by your health care provider. Make sure you discuss any questions you have with your health care provider.   Document Released: 01/14/2011 Document Revised: 04/08/2015 Document Reviewed: 09/24/2014 Elsevier Interactive Patient Education 2016 Elsevier Inc.  Nausea and Vomiting Nausea is a sick feeling that often comes before throwing up (vomiting). Vomiting is a reflex where stomach contents come out of your mouth. Vomiting can cause severe loss of body fluids (dehydration). Children and elderly adults can become dehydrated quickly, especially if they also have diarrhea. Nausea and vomiting are symptoms of a condition or disease. It is important to find the cause of your symptoms. CAUSES   Direct irritation of the stomach lining. This irritation can result from increased  acid production (gastroesophageal reflux disease), infection, food poisoning, taking certain medicines (such as nonsteroidal anti-inflammatory drugs), alcohol use, or tobacco use.  Signals from the brain.These signals could be caused by a headache, heat exposure, an inner ear disturbance, increased pressure in the brain from injury, infection, a tumor, or  a concussion, pain, emotional stimulus, or metabolic problems.  An obstruction in the gastrointestinal tract (bowel obstruction).  Illnesses such as diabetes, hepatitis, gallbladder problems, appendicitis, kidney problems, cancer, sepsis, atypical symptoms of a heart attack, or eating disorders.  Medical treatments such as chemotherapy and radiation.  Receiving medicine that makes you sleep (general anesthetic) during surgery. DIAGNOSIS Your caregiver may ask for tests to be done if the problems do not improve after a few days. Tests may also be done if symptoms are severe or if the reason for the nausea and vomiting is not clear. Tests may include:  Urine tests.  Blood tests.  Stool tests.  Cultures (to look for evidence of infection).  X-rays or other imaging studies. Test results can help your caregiver make decisions about treatment or the need for additional tests. TREATMENT You need to stay well hydrated. Drink frequently but in small amounts.You may wish to drink water, sports drinks, clear broth, or eat frozen ice pops or gelatin dessert to help stay hydrated.When you eat, eating slowly may help prevent nausea.There are also some antinausea medicines that may help prevent nausea. HOME CARE INSTRUCTIONS   Take all medicine as directed by your caregiver.  If you do not have an appetite, do not force yourself to eat. However, you must continue to drink fluids.  If you have an appetite, eat a normal diet unless your caregiver tells you differently.  Eat a variety of complex carbohydrates (rice, wheat, potatoes, bread), lean meats, yogurt, fruits, and vegetables.  Avoid high-fat foods because they are more difficult to digest.  Drink enough water and fluids to keep your urine clear or pale yellow.  If you are dehydrated, ask your caregiver for specific rehydration instructions. Signs of dehydration may include:  Severe thirst.  Dry lips and mouth.  Dizziness.  Dark  urine.  Decreasing urine frequency and amount.  Confusion.  Rapid breathing or pulse. SEEK IMMEDIATE MEDICAL CARE IF:   You have blood or brown flecks (like coffee grounds) in your vomit.  You have black or bloody stools.  You have a severe headache or stiff neck.  You are confused.  You have severe abdominal pain.  You have chest pain or trouble breathing.  You do not urinate at least once every 8 hours.  You develop cold or clammy skin.  You continue to vomit for longer than 24 to 48 hours.  You have a fever. MAKE SURE YOU:   Understand these instructions.  Will watch your condition.  Will get help right away if you are not doing well or get worse.   This information is not intended to replace advice given to you by your health care provider. Make sure you discuss any questions you have with your health care provider.   Document Released: 07/18/2005 Document Revised: 10/10/2011 Document Reviewed: 12/15/2010 Elsevier Interactive Patient Education 2016 [employer].   Emergency Department Resource Guide 1) Find a Doctor and Pay Out of Pocket Although you won't have to find out who is covered by your insurance plan, it is a good idea to ask around and get recommendations. You will then need to call the office and see if the doctor you have  chosen will accept you as a new patient and what types of options they offer for patients who are self-pay. Some doctors offer discounts or will set up payment plans for their patients who do not have insurance, but you will need to ask so you aren't surprised when you get to your appointment.  2) Contact Your Local Health Department Not all health departments have doctors that can see patients for sick visits, but many do, so it is worth a call to see if yours does. If you don't know where your local health department is, you can check in your phone book. The CDC also has a tool to help you locate your state's health department, and  many state websites also have listings of all of their local health departments.  3) Find a Walk-in Clinic If your illness is not likely to be very severe or complicated, you may want to try a walk in clinic. These are popping up all over the country in pharmacies, drugstores, and shopping centers. They're usually staffed by nurse practitioners or physician assistants that have been trained to treat common illnesses and complaints. They're usually fairly quick and inexpensive. However, if you have serious medical issues or chronic medical problems, these are probably not your best option.  No Primary Care Doctor: - Call Health Connect at  [phone] - they can help you locate a primary care doctor that  accepts your insurance, provides certain services, etc. - Physician Referral Service- [phone]  Chronic Pain Problems: Organization         Address  Phone   Notes  [facility]  [phone] Patients need to be referred by their primary care doctor.   Medication Assistance: Organization         Address  Phone   Notes  [facility] Medication [facility] [address] [place] [place], [place] [address] [phone] --Must be a resident of [facility] -- Must have NO insurance coverage whatsoever (no Medicaid/ Medicare, etc.) -- The pt. MUST have a primary care doctor that directs their care regularly and follows them in the community   [facility]ist  [phone]   [employer]  [phone]    Agencies that provide inexpensive medical care: Organization         Address  Phone   Notes  [facility]  [phone]   [facility]    [phone]   [facility] [address] [place], [place] [address] [phone]   [facility] [address] [place] [place] [phone]   Planned Parenthood    [phone]   [facility]    [phone]   Community Health  and [facility]  [address], [place] Phone:  [phone], Fax:  [phone] Hours of Operation:  9 am - 6 pm, M-F.  Also accepts Medicaid/Medicare and self-pay.  [facility]  [address], [place] Phone: [phone], Fax: [phone]. Hours of Operation:  8:30 am - 5:30 pm, M-F.  Also accepts Medicaid and self-pay.  [facility] [place] [address], [place] Phone: [phone]   [facility] [address] Natasha Bence [place], [place] [phone], Ext. [phone] Mondays & Thursdays: 7-9 AM.  First 15 patients are seen on a first come, first serve basis.    Medicaid-accepting [facility] Providers:  Organization  Address  Phone   Notes  [facility] [address], [place] [phone] Also accepts self-pay patients.  [facility] [address] Laurell Josephs [place], [place]  [phone]   [facility] [address], [place] [phone]   [facility] [address] [place] [phone]   Renaye Rakers [address], [place]   [phone] Only accepts [place] patients after they have their name applied to their card.   Self-Pay (no insurance) in [facility]:  Organization         Address  Phone   Notes  Sickle Cell Patients, [facility] Internal Medicine [address] [place], [place] [phone]   [facility] [address] [place], [place] [phone]   [facility] [place]  [address], [place] [phone]   [facility]/Dr. Osei-Bonsu  [address] [place] or [address], [place] [phone] Phone number for both [place] and [place] locations is the same.  Urgent Medical and [facility] [address], [place] [phone]   [facility]  [address], [place] or [address] [phone]   [facility] [address] [place] [phone], phone; [phone], fax Sees patients 1st and 3rd Saturday of every month.  Must not qualify for public or private insurance (i.e. Medicaid, Medicare, [place] Health Choice, Veterans' Benefits)  Household income should be no more than 200% of the poverty level The clinic cannot treat you if you are pregnant or think you are pregnant  Sexually transmitted diseases are not treated at the clinic.    Dental Care: Organization         Address  Phone  Notes  [facility] Department of [facility] [address] [place], [place] [phone] Accepts children up to age 30 who are enrolled in [place] or [place] Health Choice; pregnant women with a Medicaid card; and children who have applied for Medicaid or [place] Health Choice, but were declined, whose parents can pay a reduced fee at time of service.  [facility] Department of [facility]  [address], [place] [phone] Accepts children up to age 76 who are enrolled in [place] or [place] Health Choice; pregnant women with a Medicaid card; and children who have applied for Medicaid or [place] Health Choice, but were declined, whose parents can pay a reduced fee at time of service.  [facility]  [address] [place], [place] [phone] Patients are seen by appointment only. Walk-ins are not accepted. [facility] will see patients 41 years of age and older. Monday - Tuesday (8am-5pm) Most Wednesdays (8:30-5pm) $30 per visit, cash only  [facility] Adult Dental Access PROGRAM  [address], [facility] [phone] Patients are seen by appointment only. Walk-ins are not accepted. [facility] will see patients 99 years of age and older. One Wednesday Evening (Monthly: Volunteer Based).  $30 per visit, cash only   [employer]  [phone] for adults; Children under age 41, call Graduate Pediatric Dentistry at [phone]. Children aged 28-14, please call [phone] to request a pediatric application.  Dental services are provided in all areas of dental care including  fillings, crowns and bridges, complete and partial dentures, implants, gum treatment, root canals, and extractions. Preventive care is also provided. Treatment is provided to both adults and children. Patients are selected via a lottery and there is often a waiting list.   [facility] [address] [address], [place]  [phone] [email or web address]   [facility] [address], [place], [place] [phone], Ext. [phone] Second and Fourth Thursday of each month, opens at 6:30 AM; Clinic ends at 9 AM.  Patients are seen on a first-come first-served basis, and a limited number are seen during each clinic.   [facility]  [address], [place], [place] [phone]   Eligibility Requirements You must have lived in [place], [place], or [place] counties for at least the last three months.   You cannot be eligible for state or federal sponsored [place]healthcare insurance, including CIGNAVeterans Administration, [place]icaid, or [employer]icare.   You generally cannot be eligible for healthcare insurance through your employer.    How to apply: Eligibility screenings are held every Tuesday and Wednesday afternoon from 1:00 pm until 4:00 pm. You do not need an appointment for the interview!  [facility] [address], [place], [place] [phone]   [facility]  [phone]   [facility]  [phone]   [facility]  [phone]    Behavioral Health Resources in the Community: Intensive Outpatient Programs Organization         Address  Phone  Notes  [facility] [address], [place],  [place] [phone]   [facility] [address], [place], [place] [phone]   [facility] [address], [place], [place]  [phone]   [facility] [address],  [place], [place] [phone] or [phone]   Substance Abuse Resources Organization         Address  Phone  Notes  [facility]  [phone]   [facility]  [phone]   [facility]  [phone]   [facility]  [phone]   Residential & Outpatient Substance Abuse Program  [phone]   Psychological Services Organization         Address  Phone  Notes  [facility]  [phone]- [phone]   [facility]  [phone]- [phone]   [facility] [address], [place] [phone] or [phone]    Mobile Crisis Teams Organization         Address  Phone  Notes  [facility], Mobile Crisis Care Unit  [phone]   [facility]  [address] [place], [place] [phone]   Doristine LocksSharon DeEsch [address] [place] [phone]    Self-Help/Support Groups Organization         Address  Phone             Notes  [facility] [place] - variety of support groups  [phone] Call for more information  Narcotics Anonymous (NA), [facility]aring [facility] [address], [place]  2 meetings at this location   Statisticianesidential Treatment Programs Organization         Address  Phone  Notes  [facility] [address],    [place]  [phone]   [facility]  [address], [place] [phone], [place], [place] [phone]   [facility] [address], [place] [phone] Admissions: 8am-3pm M-F  [facility] [address]    [place], [place] [phone]   [facility] [address] [place], [place], [place] [phone]   [facility] [address],  [place], [place] [phone]   [facility] - Intensive Outpatient [address] Laurell Josephs [address], [place], [place] [phone]   [facility] ([facility] [address] [place].,  [place], [place] [phone] or [phone]   [facility] ([facility]) [address], [place], [place] [phone] Accepts Medicaid  [facility] [place] [address]  [place] [phone] Substance Abuse/Addiction Treatment   [facility] Organization         Address  Phone  Notes  [facility]  [phone]   Angie Fava, PhD [address] Ervin Knack [place], [place]   [phone] or [phone]   [facility]   [address] [place], [place] [phone]   [facility] [address], [place], [place] [phone] Insurance/Medicaid/sponsorship through [facility] and Families [address]                                    [place], [place] [phone] Therapy/tele-psych/case  [facility] [address], [place] [phone]    Dr. Lolly Mustache  [phone]   [facility]  [employer] [facility] Dept. 1) [address] [place] 2) [address] [place] 3)  [address], [place] [phone]  [phone]   [facility] Child Abuse Hotline 458-472-4179 or [phone] (After Hours)

## 2015-08-07 ENCOUNTER — Emergency Department (HOSPITAL_COMMUNITY)
Admission: EM | Admit: 2015-08-07 | Discharge: 2015-08-07 | Disposition: A | Discharge status: Home / Self Care | Payer: Self-pay

## 2015-08-07 NOTE — ED Notes (Signed)
Have called pt x 3 with no answer in lobby 

## 2015-08-07 NOTE — ED Notes (Signed)
Called pt again, no answer in lobby

## 2015-08-07 NOTE — ED Notes (Signed)
Pt called for triage and not answering

## 2016-05-29 ENCOUNTER — Encounter (HOSPITAL_COMMUNITY): Payer: Self-pay | Admitting: Emergency Medicine

## 2016-05-29 ENCOUNTER — Emergency Department (HOSPITAL_COMMUNITY): Payer: Self-pay

## 2016-05-29 ENCOUNTER — Emergency Department (HOSPITAL_COMMUNITY)
Admission: EM | Admit: 2016-05-29 | Discharge: 2016-05-29 | Disposition: A | Discharge status: Home / Self Care | Payer: Self-pay | Attending: Emergency Medicine | Admitting: Emergency Medicine

## 2016-05-29 DIAGNOSIS — Y9372 Activity, wrestling: Secondary | ICD-10-CM | POA: Insufficient documentation

## 2016-05-29 DIAGNOSIS — W500XXA Accidental hit or strike by another person, initial encounter: Secondary | ICD-10-CM | POA: Insufficient documentation

## 2016-05-29 DIAGNOSIS — M25511 Pain in right shoulder: Secondary | ICD-10-CM

## 2016-05-29 DIAGNOSIS — Y929 Unspecified place or not applicable: Secondary | ICD-10-CM | POA: Insufficient documentation

## 2016-05-29 DIAGNOSIS — F17210 Nicotine dependence, cigarettes, uncomplicated: Secondary | ICD-10-CM | POA: Insufficient documentation

## 2016-05-29 DIAGNOSIS — Y999 Unspecified external cause status: Secondary | ICD-10-CM | POA: Insufficient documentation

## 2016-05-29 DIAGNOSIS — S4991XA Unspecified injury of right shoulder and upper arm, initial encounter: Secondary | ICD-10-CM | POA: Insufficient documentation

## 2016-05-29 MED ORDER — NAPROXEN 250 MG PO TABS
500.0000 mg | ORAL_TABLET | Freq: Once | ORAL | Status: AC
  Administered 2016-05-29: 500 mg via ORAL
  Filled 2016-05-29: qty 2

## 2016-05-29 NOTE — ED Notes (Signed)
Pt understood dc material. NAD noted. 

## 2016-05-29 NOTE — ED Triage Notes (Signed)
Pt states he was wrestling with his brother earlier today and injured his right shoulder.  He feels it is "dislocated".  He does not have a history of this in the past.  He can move the arm but with pain and limited ROM.

## 2016-05-29 NOTE — ED Notes (Signed)
Ortho tech paged to place sling immobilizer

## 2016-05-29 NOTE — Progress Notes (Signed)
Orthopedic Tech Progress Note Patient Details:  Max LopesVictor R Gutierrez 08/09/1980 [id number]  Ortho Devices Type of Ortho Device: Shoulder immobilizer Ortho Device/Splint Location: Rt arm Ortho Device/Splint Interventions: Ordered, Application   Max Gutierrez 05/29/2016, 10:27 PM

## 2016-05-29 NOTE — ED Notes (Signed)
Patient transported to X-ray 
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
## 2018-02-07 ENCOUNTER — Encounter (HOSPITAL_COMMUNITY): Payer: Self-pay | Admitting: Emergency Medicine

## 2018-02-07 ENCOUNTER — Emergency Department (HOSPITAL_COMMUNITY)
Admission: EM | Admit: 2018-02-07 | Discharge: 2018-02-07 | Disposition: A | Discharge status: Home / Self Care | Payer: BLUE CROSS/BLUE SHIELD | Attending: Emergency Medicine | Admitting: Emergency Medicine

## 2018-02-07 DIAGNOSIS — Z79899 Other long term (current) drug therapy: Secondary | ICD-10-CM | POA: Insufficient documentation

## 2018-02-07 DIAGNOSIS — F17210 Nicotine dependence, cigarettes, uncomplicated: Secondary | ICD-10-CM | POA: Insufficient documentation

## 2018-02-07 DIAGNOSIS — R1032 Left lower quadrant pain: Secondary | ICD-10-CM | POA: Diagnosis not present

## 2018-02-07 NOTE — Discharge Instructions (Signed)
Please  follow-up with your doctor in 2 days for evaluation.  Please return to the ER sooner if you have any new or worsening symptoms, or if you have any of the following symptoms:  Abdominal pain that does not go away.  You have a fever.  You keep throwing up (vomiting).  The pain is felt only in portions of the abdomen. Pain in the right side could possibly be appendicitis. In an adult, pain in the left lower portion of the abdomen could be colitis or diverticulitis.  You pass bloody or black tarry stools.  There is bright red blood in the stool.  The constipation stays for more than 4 days.  There is belly (abdominal) or rectal pain.  You do not seem to be getting better.  You have any questions or concerns.
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
## 2018-02-07 NOTE — ED Triage Notes (Signed)
Pt presents to ED for assessment for 2 weeks of an uncomfortable area to his left groin.  Hx of hernia, and "I just want to be sure it's not coming back".

## 2018-02-07 NOTE — ED Notes (Signed)
Patient able to ambulate independently  

## 2018-02-09 ENCOUNTER — Ambulatory Visit: Payer: Self-pay | Admitting: Emergency Medicine

## 2019-01-07 ENCOUNTER — Other Ambulatory Visit: Payer: Self-pay

## 2019-01-07 ENCOUNTER — Emergency Department (HOSPITAL_COMMUNITY)
Admission: EM | Admit: 2019-01-07 | Discharge: 2019-01-07 | Disposition: A | Discharge status: Home / Self Care | Payer: BLUE CROSS/BLUE SHIELD | Attending: Emergency Medicine | Admitting: Emergency Medicine

## 2019-01-07 ENCOUNTER — Encounter (HOSPITAL_COMMUNITY): Payer: Self-pay | Admitting: Emergency Medicine

## 2019-01-07 DIAGNOSIS — F17210 Nicotine dependence, cigarettes, uncomplicated: Secondary | ICD-10-CM | POA: Insufficient documentation

## 2019-01-07 DIAGNOSIS — R197 Diarrhea, unspecified: Secondary | ICD-10-CM

## 2019-01-07 DIAGNOSIS — F1290 Cannabis use, unspecified, uncomplicated: Secondary | ICD-10-CM | POA: Insufficient documentation

## 2019-01-07 NOTE — ED Triage Notes (Signed)
Pt in with c/o diarrhea and abdominal cramping since last night after eating bad food. Diarrhea x 2, denies any n/v

## 2019-01-07 NOTE — Discharge Instructions (Addendum)
Be careful with your diet to prevent return of diarrhea.  Make sure you stay well hydrated. Return to the ER if you develop fevers, vomiting, severe abdominal pain, blood in your stool, or any new, worsening, or concerning symptoms.
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
## 2019-01-07 NOTE — ED Triage Notes (Signed)
Pt. Stated, I ate shrimp yesterday and it gives me diarrhea, I just went 2 times last night and none today. I just don't want to go to work today and have to go to bathroom. My stomach will rumble some but no diarrhea today just my stomach rumbling.

## 2019-01-07 NOTE — ED Triage Notes (Signed)
Pt. Stated, My work has to have a note for work.

## 2019-03-23 ENCOUNTER — Other Ambulatory Visit: Payer: Self-pay

## 2019-03-23 DIAGNOSIS — Z20822 Contact with and (suspected) exposure to covid-19: Secondary | ICD-10-CM

## 2019-03-24 LAB — NOVEL CORONAVIRUS, NAA: SARS-CoV-2, NAA: NOT DETECTED

## 2019-05-04 ENCOUNTER — Emergency Department (HOSPITAL_COMMUNITY)
Admission: EM | Admit: 2019-05-04 | Discharge: 2019-05-04 | Disposition: A | Discharge status: Home / Self Care | Payer: Self-pay | Attending: Emergency Medicine | Admitting: Emergency Medicine

## 2019-05-04 DIAGNOSIS — F1210 Cannabis abuse, uncomplicated: Secondary | ICD-10-CM | POA: Insufficient documentation

## 2019-05-04 DIAGNOSIS — L7682 Other postprocedural complications of skin and subcutaneous tissue: Secondary | ICD-10-CM | POA: Insufficient documentation

## 2019-05-04 DIAGNOSIS — F17210 Nicotine dependence, cigarettes, uncomplicated: Secondary | ICD-10-CM | POA: Insufficient documentation

## 2019-05-04 LAB — COMPREHENSIVE METABOLIC PANEL
ALT: 99 U/L — ABNORMAL HIGH (ref 0–44)
AST: 56 U/L — ABNORMAL HIGH (ref 15–41)
Albumin: 5 g/dL (ref 3.5–5.0)
Alkaline Phosphatase: 72 U/L (ref 38–126)
Anion gap: 12 (ref 5–15)
BUN: 7 mg/dL (ref 6–20)
CO2: 25 mmol/L (ref 22–32)
Calcium: 9.6 mg/dL (ref 8.9–10.3)
Chloride: 104 mmol/L (ref 98–111)
Creatinine, Ser: 1.08 mg/dL (ref 0.61–1.24)
GFR calc Af Amer: >60 mL/min (ref >60)
GFR calc non Af Amer: >60 mL/min (ref >60)
Glucose, Bld: 103 mg/dL — ABNORMAL HIGH (ref 70–99)
Potassium: 4.5 mmol/L (ref 3.5–5.1)
Sodium: 141 mmol/L (ref 135–145)
Total Bilirubin: 1.1 mg/dL (ref 0.3–1.2)
Total Protein: 7.9 g/dL (ref 6.5–8.1)

## 2019-05-04 LAB — CBC
HCT: 46.1 % (ref 39.0–52.0)
Hemoglobin: 16.1 g/dL (ref 13.0–17.0)
MCH: 33 pg (ref 26.0–34.0)
MCHC: 34.9 g/dL (ref 30.0–36.0)
MCV: 94.5 fL (ref 80.0–100.0)
Platelets: 218 10*3/uL (ref 150–400)
RBC: 4.88 MIL/uL (ref 4.22–5.81)
RDW: 12.6 % (ref 11.5–15.5)
WBC: 7.2 10*3/uL (ref 4.0–10.5)
nRBC: 0 % (ref 0.0–0.2)

## 2019-05-04 LAB — LIPASE, BLOOD: Lipase: 30 U/L (ref 11–51)

## 2019-05-04 MED ORDER — SODIUM CHLORIDE 0.9% FLUSH: 3.0000 mL | Freq: Once | INTRAVENOUS | Status: DC

## 2019-05-04 NOTE — Discharge Instructions (Signed)
Get help right away if: °You have pain in your groin that suddenly gets worse. °You have a bulge in your groin that: °Suddenly gets bigger and does not get smaller. °Becomes red or purple or painful to the touch. °You are a man and you have a sudden pain in your scrotum, or the size of your scrotum suddenly changes. °You cannot push the hernia back in place by very gently pressing on it when you are lying down. Do not try to force the bulge back in if it will not push in easily. °You have nausea or vomiting that does not go away. °You have a fast heartbeat. °You cannot have a bowel movement or pass gas. °
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
## 2019-05-04 NOTE — ED Triage Notes (Signed)
PT reports a few weeks of lower abdominal hernia pain. States today unable to go to work. Denies N/V/diarrhea/fever. VSS. NAD at present.

## 2019-07-10 ENCOUNTER — Emergency Department (HOSPITAL_COMMUNITY)
Admission: EM | Admit: 2019-07-10 | Discharge: 2019-07-10 | Disposition: A | Discharge status: Home / Self Care | Payer: BC Managed Care – PPO | Attending: Emergency Medicine | Admitting: Emergency Medicine

## 2019-07-10 ENCOUNTER — Encounter (HOSPITAL_COMMUNITY): Payer: Self-pay | Admitting: Emergency Medicine

## 2019-07-10 ENCOUNTER — Other Ambulatory Visit: Payer: Self-pay

## 2019-07-10 DIAGNOSIS — R110 Nausea: Secondary | ICD-10-CM | POA: Diagnosis present

## 2019-07-10 DIAGNOSIS — R197 Diarrhea, unspecified: Secondary | ICD-10-CM | POA: Diagnosis not present

## 2019-07-10 DIAGNOSIS — R1084 Generalized abdominal pain: Secondary | ICD-10-CM | POA: Diagnosis not present

## 2019-07-10 DIAGNOSIS — F17210 Nicotine dependence, cigarettes, uncomplicated: Secondary | ICD-10-CM | POA: Diagnosis not present

## 2019-07-10 DIAGNOSIS — R748 Abnormal levels of other serum enzymes: Secondary | ICD-10-CM

## 2019-07-10 LAB — COMPREHENSIVE METABOLIC PANEL
ALT: 85 U/L — ABNORMAL HIGH (ref 0–44)
AST: 45 U/L — ABNORMAL HIGH (ref 15–41)
Albumin: 4.2 g/dL (ref 3.5–5.0)
Alkaline Phosphatase: 53 U/L (ref 38–126)
Anion gap: 10 (ref 5–15)
BUN: 9 mg/dL (ref 6–20)
CO2: 24 mmol/L (ref 22–32)
Calcium: 9.3 mg/dL (ref 8.9–10.3)
Chloride: 108 mmol/L (ref 98–111)
Creatinine, Ser: 1.12 mg/dL (ref 0.61–1.24)
GFR calc Af Amer: >60 mL/min (ref >60)
GFR calc non Af Amer: >60 mL/min (ref >60)
Glucose, Bld: 103 mg/dL — ABNORMAL HIGH (ref 70–99)
Potassium: 4.3 mmol/L (ref 3.5–5.1)
Sodium: 142 mmol/L (ref 135–145)
Total Bilirubin: 0.7 mg/dL (ref 0.3–1.2)
Total Protein: 7.2 g/dL (ref 6.5–8.1)

## 2019-07-10 LAB — LIPASE, BLOOD: Lipase: 31 U/L (ref 11–51)

## 2019-07-10 LAB — CBC
HCT: 45.8 % (ref 39.0–52.0)
Hemoglobin: 15.7 g/dL (ref 13.0–17.0)
MCH: 32.4 pg (ref 26.0–34.0)
MCHC: 34.3 g/dL (ref 30.0–36.0)
MCV: 94.6 fL (ref 80.0–100.0)
Platelets: 204 10*3/uL (ref 150–400)
RBC: 4.84 MIL/uL (ref 4.22–5.81)
RDW: 12.4 % (ref 11.5–15.5)
WBC: 6.9 10*3/uL (ref 4.0–10.5)
nRBC: 0 % (ref 0.0–0.2)

## 2019-07-10 MED ORDER — SODIUM CHLORIDE 0.9% FLUSH: 3.0000 mL | Freq: Once | INTRAVENOUS | Status: DC

## 2019-07-10 NOTE — ED Triage Notes (Addendum)
C/o nausea, diarrhea, and chills since 7:30pm.  States he had a seafood feast around 11:30am yesterday and relates the symptoms to that.  States he didn't go into work today and needs a note.  Reports same symptoms every time he eats seafood.  Denies abd pain.

## 2019-07-10 NOTE — ED Notes (Signed)
Blood work drawn in triage 

## 2019-07-10 NOTE — Discharge Instructions (Addendum)
Follow-up with your primary care provider for recheck and your elevated liver enzymes.  These were also high in October when you were here.  Return to the emergency room for any worsening or concerning symptoms.  Urgent care visits are appropriate for work notes if needed and may provide quicker more affordable care.

## 2019-07-10 NOTE — ED Notes (Signed)
Patient unable to provide a urine sample provider notified stated does not need sample and will be discharged.
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
## 2019-10-27 ENCOUNTER — Emergency Department (HOSPITAL_COMMUNITY)
Admission: EM | Admit: 2019-10-27 | Discharge: 2019-10-27 | Disposition: A | Discharge status: Home / Self Care | Payer: Self-pay | Attending: Emergency Medicine | Admitting: Emergency Medicine

## 2019-10-27 ENCOUNTER — Encounter (HOSPITAL_COMMUNITY): Payer: Self-pay | Admitting: Emergency Medicine

## 2019-10-27 ENCOUNTER — Other Ambulatory Visit: Payer: Self-pay

## 2019-10-27 DIAGNOSIS — Z79899 Other long term (current) drug therapy: Secondary | ICD-10-CM | POA: Insufficient documentation

## 2019-10-27 DIAGNOSIS — R197 Diarrhea, unspecified: Secondary | ICD-10-CM | POA: Insufficient documentation

## 2019-10-27 DIAGNOSIS — F17210 Nicotine dependence, cigarettes, uncomplicated: Secondary | ICD-10-CM | POA: Insufficient documentation

## 2019-10-27 DIAGNOSIS — R112 Nausea with vomiting, unspecified: Secondary | ICD-10-CM
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
## 2019-10-27 NOTE — ED Triage Notes (Signed)
C/o generalized abd pain, feeling bloated, nausea, vomiting, and diarrhea that started after eating shrimp last night.   States nausea and vomiting has resolved. Reports same symptoms every time he eats shrimp.  Pt refused lab work at triage. Denies rash or SOB.

## 2021-03-23 ENCOUNTER — Other Ambulatory Visit: Payer: Self-pay

## 2021-03-23 ENCOUNTER — Emergency Department (HOSPITAL_COMMUNITY): Payer: 59

## 2021-03-23 ENCOUNTER — Emergency Department (HOSPITAL_COMMUNITY)
Admission: EM | Admit: 2021-03-23 | Discharge: 2021-03-24 | Disposition: A | Discharge status: Home / Self Care | Payer: 59 | Attending: Emergency Medicine | Admitting: Emergency Medicine

## 2021-03-23 DIAGNOSIS — M25561 Pain in right knee: Secondary | ICD-10-CM | POA: Diagnosis not present

## 2021-03-23 DIAGNOSIS — S8991XA Unspecified injury of right lower leg, initial encounter: Secondary | ICD-10-CM | POA: Diagnosis not present

## 2021-03-23 DIAGNOSIS — W01198A Fall on same level from slipping, tripping and stumbling with subsequent striking against other object, initial encounter: Secondary | ICD-10-CM | POA: Insufficient documentation

## 2021-03-23 DIAGNOSIS — F17210 Nicotine dependence, cigarettes, uncomplicated: Secondary | ICD-10-CM | POA: Diagnosis not present

## 2021-03-23 NOTE — ED Triage Notes (Signed)
Pt reports he stumbled and injured his right knee. Pt reports his knee feels unstable and is painful.

## 2021-03-23 NOTE — ED Provider Notes (Signed)
Emergency Medicine Provider Triage Evaluation Note  Max Gutierrez , a 40 y.o. male  was evaluated in triage.  Pt complains of right knee pain.  The patient reports that he stepped in indention last night with his right foot, which caused his leg to twist outward.  States that he heard a pop in his knee at the time of the incident.  He was able to limp to bed, but pain was not improved when he awoke this morning.  He states that he feels as if his knee is intermittently locking up.  Pain is maximal in the medial aspect of the knee.  No numbness, weakness, prior knee injuries or surgeries, right hip or ankle pain.   Review of Systems  Positive: Arthralgias, myalgias Negative: Numbness, weakness, rash, erythema, wounds  Physical Exam  BP (!) 151/101 (BP Location: Right Arm)   Pulse 66   Temp 99.2 F (37.3 C) (Oral)   Resp 18   Ht 6\' 1"  (1.854 m)   Wt 108.9 kg   SpO2 97%   BMI 31.66 kg/m  Gen:   Awake, no distress   Resp:  Normal effort  MSK:   Moves extremities without difficulty  Other:  Tender to palpation over the medial joint line of the right knee.  He has neurovascular intact to the bilateral lower extremities.  Medical Decision Making  Medically screening exam initiated at 10:43 PM.  Appropriate orders placed.  OSLO HUNTSMAN was informed that the remainder of the evaluation will be completed by another provider, this initial triage assessment does not replace that evaluation, and the importance of remaining in the ED until their evaluation is complete.  X-ray of the right knee has been ordered.  He will require further work-up and evaluation in the emergency department.   Donnajean Lopes A, PA-C 03/23/21 2246    03/25/21, MD 03/24/21 2015

## 2021-03-24 MED ORDER — METHOCARBAMOL 500 MG PO TABS: 500.0000 mg | ORAL_TABLET | Freq: Two times a day (BID) | ORAL | 0 refills | Status: AC

## 2021-03-24 MED ORDER — KETOROLAC TROMETHAMINE 60 MG/2ML IM SOLN
30.0000 mg | Freq: Once | INTRAMUSCULAR | Status: DC
Start: 2021-03-24 — End: 2021-03-24

## 2021-03-24 NOTE — Progress Notes (Signed)
Orthopedic Tech Progress Note Patient Details:  Max Gutierrez February 23, 1981 [id number]  Ortho Devices Type of Ortho Device: Knee Immobilizer, Crutches Ortho Device/Splint Location: RLE Ortho Device/Splint Interventions: Ordered, Application, Adjustment   Post Interventions Patient Tolerated: Well Instructions Provided: Adjustment of device, Care of device, Poper ambulation with device  [name] 03/24/2021, 5:44 AM
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
## 2021-03-24 NOTE — ED Notes (Signed)
Pt discharged and ambulated with crutches without difficulty.

## 2021-03-24 NOTE — Discharge Instructions (Signed)
Thank you for allowing me to care for you today in the Emergency Department.   Your x-ray did not show any broken bones.  However, an x-ray is not good and showing some of the ligaments and other structures of the inside of the knee.  To manage her symptoms at home:  Wear the knee immobilizer when you are up and walking to stabilize and reduce pain on the knee.  You can also use the crutches until you can put weight on your right leg without considerable pain.  You can remove the knee immobilizer when you are sleeping.  Elevate your left leg so that your toes are out above the level of your nose.  You can apply ice pack for 15 to 20 minutes up to 3-4 times a day for the next 4 to 5 days.  Take 650 mg of Tylenol or 600 mg of ibuprofen with food every 6 hours for pain.  You can alternate between these 2 medications every 3 hours if your pain returns.  For instance, you can take Tylenol at noon, followed by a dose of ibuprofen at 3, followed by second dose of Tylenol and 6.  You can take 1 tablet of Robaxin, muscle relaxer, 2 times daily as needed for muscle pain or spasms.  You can take Robaxin with Tylenol or Motrin.  However, do not work or drive while taking Robaxin until you know how it affects you as sometimes it can make you drowsy.  Call to schedule a follow-up appointment with the orthopedics team.  As we discussed, you should not return to work until they have cleared you.  Return to the emergency department if you develop severe redness and swelling to the calf, if your toes turn blue, if you have new falls or injuries, uncontrollable pain, or other new, concerning symptoms.

## 2021-04-01 ENCOUNTER — Other Ambulatory Visit: Payer: Self-pay

## 2021-04-01 ENCOUNTER — Ambulatory Visit (INDEPENDENT_AMBULATORY_CARE_PROVIDER_SITE_OTHER): Payer: 59 | Admitting: Orthopaedic Surgery

## 2021-04-01 ENCOUNTER — Encounter: Payer: Self-pay | Admitting: Orthopaedic Surgery

## 2021-04-01 VITALS — Ht 73.0 in | Wt 240.0 lb

## 2021-04-01 DIAGNOSIS — M25561 Pain in right knee: Secondary | ICD-10-CM | POA: Diagnosis not present
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
## 2021-04-13 ENCOUNTER — Other Ambulatory Visit: Payer: Self-pay

## 2021-04-13 ENCOUNTER — Encounter: Payer: Self-pay | Admitting: Orthopaedic Surgery

## 2021-04-13 ENCOUNTER — Ambulatory Visit (INDEPENDENT_AMBULATORY_CARE_PROVIDER_SITE_OTHER): Payer: 59 | Admitting: Orthopaedic Surgery

## 2021-04-13 VITALS — Ht 73.0 in | Wt 240.0 lb

## 2021-04-13 DIAGNOSIS — M25561 Pain in right knee: Secondary | ICD-10-CM
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
## 2021-04-15 ENCOUNTER — Ambulatory Visit: Payer: 59 | Admitting: Orthopaedic Surgery

## 2021-05-25 ENCOUNTER — Ambulatory Visit (INDEPENDENT_AMBULATORY_CARE_PROVIDER_SITE_OTHER): Payer: 59 | Admitting: Orthopaedic Surgery

## 2021-05-25 ENCOUNTER — Other Ambulatory Visit: Payer: Self-pay

## 2021-05-25 DIAGNOSIS — G8929 Other chronic pain: Secondary | ICD-10-CM

## 2021-05-25 DIAGNOSIS — M25561 Pain in right knee: Secondary | ICD-10-CM
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
## 2021-05-26 ENCOUNTER — Telehealth: Payer: Self-pay

## 2021-05-26 NOTE — Telephone Encounter (Signed)
Patient came in for appt. He states he cannot afford to pay for FMLA forms for [employer]. I just filled the forms myself.  Forms are ready for pick up at the front desk.

## 2021-06-15 ENCOUNTER — Other Ambulatory Visit: Payer: Self-pay

## 2021-06-15 ENCOUNTER — Ambulatory Visit (INDEPENDENT_AMBULATORY_CARE_PROVIDER_SITE_OTHER): Payer: 59 | Admitting: Orthopaedic Surgery

## 2021-06-15 ENCOUNTER — Encounter: Payer: Self-pay | Admitting: Orthopaedic Surgery

## 2021-06-15 DIAGNOSIS — G8929 Other chronic pain: Secondary | ICD-10-CM | POA: Diagnosis not present

## 2021-06-15 DIAGNOSIS — M25561 Pain in right knee: Secondary | ICD-10-CM | POA: Diagnosis not present
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
## 2021-06-17 ENCOUNTER — Telehealth: Payer: Self-pay | Admitting: Orthopaedic Surgery

## 2021-06-17 ENCOUNTER — Ambulatory Visit: Payer: 59 | Admitting: Internal Medicine

## 2021-06-17 NOTE — Telephone Encounter (Signed)
Patient called. Says the wrong information was sent in to [facility] financial. Would like a call back from [place].

## 2021-06-17 NOTE — Telephone Encounter (Signed)
Spoke to patient nothing was filled out incorrectly. [facility] needed OV note sent to them and updated forms. I advised patient ov note faxed but no new forms  were received yet

## 2021-06-23 ENCOUNTER — Telehealth: Payer: Self-pay | Admitting: Orthopaedic Surgery

## 2021-06-23 NOTE — Telephone Encounter (Signed)
Called left pt vm to set an appt with Dr. Roda Shutters for MRI review after 07/04/21.

## 2021-06-29 ENCOUNTER — Telehealth: Payer: Self-pay | Admitting: Orthopaedic Surgery

## 2021-06-29 NOTE — Telephone Encounter (Signed)
Pt called requesting a call back concerning his MRI. Pt states he feel MRI is not needed. Pt states to be feeling better. Please call pt at [phone].

## 2021-06-29 NOTE — Telephone Encounter (Signed)
Ok that's fine.  It's really up to him.

## 2021-06-29 NOTE — Telephone Encounter (Signed)
Called pt and left vm for pt to call and set an MRI review appt with Dr. Roda Shutters after 12/4.

## 2021-06-30 NOTE — Telephone Encounter (Signed)
MRI cancelled as requested by pt

## 2021-07-04 ENCOUNTER — Other Ambulatory Visit: Payer: 59

## 2021-07-07 ENCOUNTER — Ambulatory Visit (INDEPENDENT_AMBULATORY_CARE_PROVIDER_SITE_OTHER): Payer: 59 | Admitting: Orthopaedic Surgery

## 2021-07-07 ENCOUNTER — Other Ambulatory Visit: Payer: Self-pay

## 2021-07-07 ENCOUNTER — Encounter: Payer: Self-pay | Admitting: Orthopaedic Surgery

## 2021-07-07 ENCOUNTER — Telehealth: Payer: Self-pay

## 2021-07-07 DIAGNOSIS — G8929 Other chronic pain: Secondary | ICD-10-CM

## 2021-07-07 DIAGNOSIS — M25561 Pain in right knee: Secondary | ICD-10-CM
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
## 2021-07-07 NOTE — Telephone Encounter (Signed)
Faxed OV Note and work note to [employer] per patients request  Fax number   [phone]

## 2021-08-17 ENCOUNTER — Other Ambulatory Visit: Payer: Self-pay

## 2021-08-17 ENCOUNTER — Emergency Department (HOSPITAL_COMMUNITY): Payer: 59

## 2021-08-17 ENCOUNTER — Emergency Department (HOSPITAL_COMMUNITY)
Admission: EM | Admit: 2021-08-17 | Discharge: 2021-08-17 | Disposition: A | Discharge status: Home / Self Care | Payer: 59 | Attending: Emergency Medicine | Admitting: Emergency Medicine

## 2021-08-17 ENCOUNTER — Emergency Department (HOSPITAL_COMMUNITY): Admission: EM | Admit: 2021-08-17 | Discharge: 2021-08-17 | Discharge status: Against Medical Advice | Payer: 59

## 2021-08-17 ENCOUNTER — Encounter (HOSPITAL_COMMUNITY): Payer: Self-pay

## 2021-08-17 DIAGNOSIS — H5711 Ocular pain, right eye: Secondary | ICD-10-CM | POA: Diagnosis not present

## 2021-08-17 DIAGNOSIS — Z79899 Other long term (current) drug therapy: Secondary | ICD-10-CM | POA: Insufficient documentation

## 2021-08-17 DIAGNOSIS — H532 Diplopia: Secondary | ICD-10-CM

## 2021-08-17 DIAGNOSIS — R519 Headache, unspecified: Secondary | ICD-10-CM | POA: Diagnosis not present

## 2021-08-17 DIAGNOSIS — R200 Anesthesia of skin: Secondary | ICD-10-CM

## 2021-08-17 DIAGNOSIS — H539 Unspecified visual disturbance: Secondary | ICD-10-CM | POA: Diagnosis not present

## 2021-08-17 DIAGNOSIS — R209 Unspecified disturbances of skin sensation: Secondary | ICD-10-CM | POA: Diagnosis not present

## 2021-08-17 DIAGNOSIS — R7401 Elevation of levels of liver transaminase levels: Secondary | ICD-10-CM | POA: Diagnosis not present

## 2021-08-17 LAB — DIFFERENTIAL
Abs Immature Granulocytes: 0.03 10*3/uL (ref 0.00–0.07)
Basophils Absolute: 0 10*3/uL (ref 0.0–0.1)
Basophils Relative: 1 %
Eosinophils Absolute: 0.2 10*3/uL (ref 0.0–0.5)
Eosinophils Relative: 2 %
Immature Granulocytes: 1 %
Lymphocytes Relative: 38 %
Lymphs Abs: 2.4 10*3/uL (ref 0.7–4.0)
Monocytes Absolute: 0.7 10*3/uL (ref 0.1–1.0)
Monocytes Relative: 10 %
Neutro Abs: 3.1 10*3/uL (ref 1.7–7.7)
Neutrophils Relative %: 48 %

## 2021-08-17 LAB — COMPREHENSIVE METABOLIC PANEL
ALT: 146 U/L — ABNORMAL HIGH (ref 0–44)
AST: 63 U/L — ABNORMAL HIGH (ref 15–41)
Albumin: 4.6 g/dL (ref 3.5–5.0)
Alkaline Phosphatase: 53 U/L (ref 38–126)
Anion gap: 7 (ref 5–15)
BUN: 16 mg/dL (ref 6–20)
CO2: 24 mmol/L (ref 22–32)
Calcium: 9.1 mg/dL (ref 8.9–10.3)
Chloride: 106 mmol/L (ref 98–111)
Creatinine, Ser: 0.88 mg/dL (ref 0.61–1.24)
GFR, Estimated: >60 mL/min (ref >60)
Glucose, Bld: 97 mg/dL (ref 70–99)
Potassium: 4 mmol/L (ref 3.5–5.1)
Sodium: 137 mmol/L (ref 135–145)
Total Bilirubin: 0.7 mg/dL (ref 0.3–1.2)
Total Protein: 7.5 g/dL (ref 6.5–8.1)

## 2021-08-17 LAB — CBC
HCT: 43.8 % (ref 39.0–52.0)
Hemoglobin: 15 g/dL (ref 13.0–17.0)
MCH: 32.4 pg (ref 26.0–34.0)
MCHC: 34.2 g/dL (ref 30.0–36.0)
MCV: 94.6 fL (ref 80.0–100.0)
Platelets: 205 10*3/uL (ref 150–400)
RBC: 4.63 MIL/uL (ref 4.22–5.81)
RDW: 12.7 % (ref 11.5–15.5)
WBC: 6.4 10*3/uL (ref 4.0–10.5)
nRBC: 0 % (ref 0.0–0.2)

## 2021-08-17 LAB — PROTIME-INR
INR: 1 (ref 0.8–1.2)
Prothrombin Time: 13.3 seconds (ref 11.4–15.2)

## 2021-08-17 LAB — ETHANOL: Alcohol, Ethyl (B): <10 mg/dL (ref <10)

## 2021-08-17 LAB — APTT: aPTT: 31 seconds (ref 24–36)

## 2021-08-17 MED ORDER — GADOBUTROL 1 MMOL/ML IV SOLN
10.0000 mL | Freq: Once | INTRAVENOUS | Status: AC | PRN
  Administered 2021-08-17: 10 mL via INTRAVENOUS

## 2021-08-17 MED ORDER — DIPHENHYDRAMINE HCL 25 MG PO CAPS
50.0000 mg | ORAL_CAPSULE | Freq: Once | ORAL | Status: AC
  Administered 2021-08-17: 50 mg via ORAL
  Filled 2021-08-17: qty 2

## 2021-08-17 MED ORDER — METOCLOPRAMIDE HCL 5 MG/ML IJ SOLN
10.0000 mg | Freq: Once | INTRAMUSCULAR | Status: AC
Start: 2021-08-17 — End: 2021-08-17
  Administered 2021-08-17: 10 mg via INTRAVENOUS
  Filled 2021-08-17: qty 2

## 2021-08-17 MED ORDER — KETOROLAC TROMETHAMINE 15 MG/ML IJ SOLN
15.0000 mg | Freq: Once | INTRAMUSCULAR | Status: AC
  Administered 2021-08-17: 15 mg via INTRAVENOUS
  Filled 2021-08-17: qty 1

## 2021-08-17 NOTE — ED Triage Notes (Signed)
Pt arrived via POV, c/o vision blurriness x2 months, turned to double vision for several weeks. Endorses some right sided facial (mostly around mouth/lip) numbness. Denies any extremity numbness or weakness. Also endorses headache for over a month as well.

## 2021-08-17 NOTE — ED Notes (Signed)
Pt decided to leave while waiting in the lobby.
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
## 2021-08-17 NOTE — ED Provider Notes (Signed)
[facility] DEPT Provider Note   CSN: [id number] Arrival date & time: 08/17/21  1556     History  Chief Complaint  Patient presents with   Facial Numbness   Headache   Visual Field Change    Max Gutierrez is a 41 y.o. male with no chronic medical conditions.  Presents to the emergency department with a chief complaint of facial numbness, pain behind right eye, visual disturbance.  Patient states that 2 months prior he started having intermittent pain behind his right eye.  This pain has been intermittent but gradually worsening over the last 2 months.  Patient states that yesterday evening the pain became worse and has ever been which scared him to come to the [facility] today for further evaluation.  At present patient rates pain 7/10 on the pain scale.  Patient denies any aggravating factors.  Patient has not tried any modalities to alleviate his symptoms.  Patient states that he started having blurry vision with the onset of pain behind his right eye.  States that blurry vision was relieved when covering one eye.  States that blurry vision gradually became worse and became double vision.  Patient states that he has had diplopia for the last month.  Again diplopia is resolved if covering one eye or looking in his peripheral vision.  Patient has taken to wearing a eye patch due to his diplopia.  Patient also reports that he has developed numbness above his right lip.  States that this has been present and constant x1 month.  Patient first noticed numbness when trimming his mustache.  Patient reports that numbness has not spread since his onset.  Denies any other areas of numbness.  Patient reports that he saw ophthalmologist Dr. Katy Fitch 2 weeks ago with and any acute findings on eye exam.  Patient states that Dr. Carolynn Sayers wanted to get an MRI of both his orbits and brain however states that imaging was never scheduled.  Patient denies any recent falls or  traumatic injuries.  Patient endorses previous heavy drinking.  He states that he was drinking 6-10 beers daily however has not had any alcohol since January 1.  Patient states that he smokes marijuana every other day.  2 days prior patient reports taking 2 ecstasy pills in an attempt to relieve his eye pain.   Headache Associated symptoms: numbness   Associated symptoms: no abdominal pain, no back pain, no dizziness, no fever, no nausea, no neck pain, no seizures, no vomiting and no weakness       Home Medications Prior to Admission medications   Medication Sig Start Date End Date Taking? Authorizing Provider  benzonatate (TESSALON) 100 MG capsule Take 1 capsule (100 mg total) by mouth every 8 (eight) hours. 06/29/15   Marella Chimes, PA-C  diphenoxylate-atropine (LOMOTIL) 2.5-0.025 MG per tablet Take 1 tablet by mouth 4 (four) times daily as needed for diarrhea or loose stools. 03/27/13   Tanna Furry, MD  methocarbamol (ROBAXIN) 500 MG tablet Take 1 tablet (500 mg total) by mouth 2 (two) times daily. 03/24/21   McDonald, Mia A, PA-C  ondansetron (ZOFRAN) 4 MG tablet Take 1 tablet (4 mg total) by mouth every 6 (six) hours. 03/27/13   Tanna Furry, MD      Allergies    Patient has no known allergies.    Review of Systems   Review of Systems  Constitutional:  Negative for chills and fever.  HENT:  Negative for facial swelling.  Eyes:  Positive for visual disturbance.  Respiratory:  Negative for shortness of breath.   Cardiovascular:  Negative for chest pain.  Gastrointestinal:  Negative for abdominal pain, nausea and vomiting.  Genitourinary:  Negative for difficulty urinating and dysuria.  Musculoskeletal:  Negative for back pain and neck pain.  Skin:  Negative for color change and rash.  Neurological:  Positive for numbness and headaches. Negative for dizziness, tremors, seizures, syncope, facial asymmetry, speech difficulty, weakness and light-headedness.   Psychiatric/Behavioral:  Negative for confusion.    Physical Exam Updated Vital Signs BP (!) 159/96    Pulse 62    Temp 99 F (37.2 C) (Oral)    Resp 16    SpO2 97%  Physical Exam Vitals and nursing note reviewed.  Constitutional:      General: He is not in acute distress.    Appearance: He is not ill-appearing, toxic-appearing or diaphoretic.  Eyes:     General: No visual field deficit or scleral icterus.       Right eye: No discharge.        Left eye: No discharge.     Extraocular Movements: Extraocular movements intact.     Conjunctiva/sclera: Conjunctivae normal.     Pupils: Pupils are equal, round, and reactive to light.     Comments: Proptosis to right eyelid  Cardiovascular:     Rate and Rhythm: Normal rate.  Pulmonary:     Effort: Pulmonary effort is normal. No tachypnea, bradypnea or respiratory distress.     Breath sounds: Normal breath sounds. No stridor.  Abdominal:     Palpations: Abdomen is soft.     Tenderness: There is no abdominal tenderness.  Musculoskeletal:     Cervical back: Normal range of motion and neck supple. No edema, erythema, signs of trauma, rigidity, torticollis or crepitus. No pain with movement, spinous process tenderness or muscular tenderness. Normal range of motion.  Skin:    General: Skin is warm and dry.  Neurological:     General: No focal deficit present.     Mental Status: He is alert.     GCS: GCS eye subscore is 4. GCS verbal subscore is 5. GCS motor subscore is 6.     Cranial Nerves: No cranial nerve deficit, dysarthria or facial asymmetry.     Sensory: Sensory deficit present.     Motor: No weakness, tremor, seizure activity or pronator drift.     Coordination: Romberg sign negative. Finger-Nose-Finger Test abnormal.     Gait: Gait is intact. Gait normal.     Comments: Decree sensation to light touch above right upper lip.  Sensation to light touch throughout the facial nerves, bilateral upper and lower extremities intact.   Psychiatric:        Behavior: Behavior is cooperative.    ED Results / Procedures / Treatments   Labs (all labs ordered are listed, but only abnormal results are displayed) Labs Reviewed  COMPREHENSIVE METABOLIC PANEL - Abnormal; Notable for the following components:      Result Value   AST 63 (*)    ALT 146 (*)    All other components within normal limits  ETHANOL  PROTIME-INR  APTT  CBC  DIFFERENTIAL  RAPID URINE DRUG SCREEN, HOSP PERFORMED  URINALYSIS, ROUTINE W REFLEX MICROSCOPIC    EKG EKG Interpretation  Date/Time:  Tuesday August 17 2021 16:38:52 EST Ventricular Rate:  52 PR Interval:  154 QRS Duration: 98 QT Interval:  421 QTC Calculation: 392 R Axis:  74 Text Interpretation: Sinus rhythm Normal ECG No old tracing to compare Confirmed by Susy Frizzle [phone]) on 08/17/2021 4:41:27 PM  Radiology CT HEAD WO CONTRAST  Result Date: 08/17/2021 CLINICAL DATA:  Double vision in the right eye, facial droop, right facial numbness for 2 months EXAM: CT HEAD WITHOUT CONTRAST TECHNIQUE: Contiguous axial images were obtained from the base of the skull through the vertex without intravenous contrast. RADIATION DOSE REDUCTION: This exam was performed according to the departmental dose-optimization program which includes automated exposure control, adjustment of the mA and/or kV according to patient size and/or use of iterative reconstruction technique. COMPARISON:  None. FINDINGS: Brain: No evidence of acute infarction, hemorrhage, hydrocephalus, extra-axial collection or mass lesion/mass effect. Vascular: No hyperdense vessel or unexpected calcification. Skull: Normal. Negative for fracture or focal lesion. Sinuses/Orbits: No acute finding. Other: None. IMPRESSION: No acute intracranial pathology. No noncontrast CT findings to explain right sided vision changes. Electronically Signed   By: Jearld Lesch M.D.   On: 08/17/2021 16:38   MR BRAIN WO CONTRAST  Result Date:  08/17/2021 CLINICAL DATA:  Neuro deficit, acute, stroke suspected. Diplopia. Pressure behind the eyes. EXAM: MRI HEAD WITHOUT CONTRAST TECHNIQUE: Multiplanar, multiecho pulse sequences of the brain and surrounding structures were obtained without intravenous contrast. COMPARISON:  Head CT 08/17/2021 FINDINGS: Brain: There is no evidence of an acute infarct, intracranial hemorrhage, mass, midline shift, or extra-axial fluid collection. The ventricles and sulci are normal. There are several punctate foci of T2 hyperintensity in the subcortical white matter of both cerebral hemispheres. Vascular: Major intracranial vascular flow voids are preserved. Skull and upper cervical spine: Unremarkable bone marrow signal. Sinuses/Orbits: Unremarkable orbits. Minimal mucosal thickening in the paranasal sinuses. Clear mastoid air cells. Other: None. IMPRESSION: 1. No acute intracranial abnormality. 2. Minimal cerebral white matter T2 signal changes, nonspecific though may reflect early chronic small vessel ischemia, migraines, or prior infection/inflammation. Electronically Signed   By: Sebastian Ache M.D.   On: 08/17/2021 19:02   MR ORBITS W WO CONTRAST  Result Date: 08/17/2021 CLINICAL DATA:  Initial evaluation for acute diplopia, pressure behind eyes. EXAM: MRI OF THE ORBITS WITHOUT AND WITH CONTRAST TECHNIQUE: Multiplanar, multi-echo pulse sequences of the orbits and surrounding structures were acquired including fat saturation techniques, before and after intravenous contrast administration. CONTRAST:  41mL GADAVIST GADOBUTROL 1 MMOL/ML IV SOLN COMPARISON:  Prior brain MRI from earlier the same day. FINDINGS: Orbits: Examination is somewhat technically limited as the coronal post contrast sequence is malaligned, incompletely visualized in the orbital soft tissues. Globes are symmetric in size with normal appearance and morphology bilaterally. Optic nerves symmetric and within normal limits. No visible intrinsic optic nerve  edema or enhancement to suggest optic neuritis. No abnormality seen about either optic nerve sheath complex. No abnormality about the orbital apices. Optic chiasm normally situated within the suprasellar cistern. Extra-ocular muscles symmetric and within normal limits. Intraconal and extraconal fat well-maintained. Lacrimal glands normal. Superior orbital veins symmetric and within normal limits. No abnormality about the cavernous sinus. Visualized sinuses: Mild scattered mucosal thickening noted within the sphenoethmoidal sinuses. Visualized paranasal sinuses are otherwise clear. Soft tissues: Unremarkable. Limited intracranial: Better evaluated on prior brain MRI performed earlier the same day. No visible abnormality. IMPRESSION: Normal MRI of the orbits. No findings to explain patient's symptoms identified. Electronically Signed   By: Rise Mu M.D.   On: 08/17/2021 19:19    Procedures Procedures    Medications Ordered in ED Medications  gadobutrol (GADAVIST) 1 MMOL/ML injection 10  mL (10 mLs Intravenous Contrast Given 08/17/21 1830)  metoCLOPramide (REGLAN) injection 10 mg (10 mg Intravenous Given 08/17/21 2006)  diphenhydrAMINE (BENADRYL) capsule 50 mg (50 mg Oral Given 08/17/21 2006)  ketorolac (TORADOL) 15 MG/ML injection 15 mg (15 mg Intravenous Given 08/17/21 2006)    ED Course/ Medical Decision Making/ A&P Clinical Course as of 08/18/21 0036  Tue Aug 17, 2021  1950 Spoke to on-call neurologist Dr. Leonel Ramsay who advised that if patient had recent visit ophthalmologist it is reasonable abdomen discharge and follow-up closely with neurology and ophthalmologist in outpatient setting. [PB]    Clinical Course User Index [PB] Loni Beckwith, PA-C                           Medical Decision Making Amount and/or Complexity of Data Reviewed Radiology: ordered.  Risk Prescription drug management.   This patient presents to the ED for concern of the diplopia, numbness, pain  behind left eye this involves an extensive number of treatment options, and is a complaint that carries with it a high risk of complications and morbidity.  The differential diagnosis includes but not limited to CVA, optic neuritis, multiple sclerosis, complex migraine, pseudotumor cerebri.   Co morbidities that complicate the patient evaluation  N/a   Additional history obtained:  External records from outside source obtained and reviewed including previous provider notes and lab work   Lab Tests:  I Ordered, and personally interpreted labs.  The pertinent results include:   CMP shows elevated AST and ALT, likely secondary to patient's reports of any alcohol use CBC unremarkable Ethanol less than 10   Imaging Studies ordered:  I ordered imaging studies including noncontrast head CT, MRI brain without contrast, and MRI brain with and without contrast I independently visualized and interpreted CT imaging imaging which showed no acute intracranial abnormality. MRI brain shows no acute intracranial abnormality.  MRI orbits shows no acute abnormality I agree with the radiologist interpretation   Cardiac Monitoring:  The patient was maintained on a cardiac monitor.  I personally viewed and interpreted the cardiac monitored which showed an underlying rhythm of: Sinus rhythm   Medicines ordered and prescription drug management:  I ordered medication including Reglan, Benadryl, and Toradol for pain management Reevaluation of the patient after these medicines showed that the patient improved I have reviewed the patients home medicines and have made adjustments as needed   Test Considered:  LP was considered to evaluate opening pressure for pseudotumor cerebri.  This procedure was not performed due to patient having recent exam by ophthalmologist with no reported abnormalities or papilledema   Consultations Obtained:  I requested consultation with the on-call neurologist Dr.  Leonel Ramsay,  and discussed lab and imaging findings as well as pertinent plan - they recommend: Follow-up in outpatient setting with neurology and ophthalmologist   Problem List / ED Course:  Visual disturbance, numbness, right eye pain Patient reports 52-month history of visual disturbance, 1 month history of numbness above right lip.  2 months of intermittent right eye pain. On exam EOM intact, pupils PERRL.  Patient has decreased sensation above right lip, sensation to light touch intact throughout the rest of patient's face as well as bilateral upper and lower extremities.  Patient has abnormal finger-nose. Due to patient's reports of diplopia and over this will obtain MRI of brain without contrast as well as MRI orbits with and without contrast. MRI imaging shows no acute abnormalities. Spoke to neurology  who recommends follow-up with neurology and ophthalmologist in outpatient setting Patient was given Reglan, Benadryl, and Toradol for pain management with improvement in his symptoms. Patient hemodynamically stable at this time.  Will discharge patient to follow-up with neurology and ophthalmologist in outpatient setting.  Ambulatory referral was placed for neurology. Results, return precautions, and follow-up were discussed with patient and patient's significant other at bedside.  Patient care was discussed with attending physician Dr.Trifan   Reevaluation:  After the interventions noted above, I reevaluated the patient and found that they have :improved   Disposition:  After consideration of the diagnostic results and the patients response to treatment, I feel that the patent would benefit from discharge and close follow-up with neurology and ophthalmologist.  Discussed results, findings, treatment and follow up. Patient advised of return precautions. Patient verbalized understanding and agreed with plan.           Final Clinical Impression(s) / ED Diagnoses Final  diagnoses:  Diplopia  Numbness    Rx / DC Orders ED Discharge Orders          Ordered    Ambulatory referral to Neurology       Comments: An appointment is requested in approximately: 1 week   08/17/21 2041              Loni Beckwith, PA-C 08/18/21 [id number]    Wyvonnia Dusky, MD 08/18/21 1041

## 2021-08-17 NOTE — Discharge Instructions (Addendum)
You came to the emergency department today to be evaluated for your visual changes and numbness.  The MRI of your orbits with and without contrast did not show any acute abnormalities.  The MRI of your brain without contrast did not show any acute abnormalities.  Your lab work was reassuring.  You will need to follow-up closely in the outpatient setting with neurology, ophthalmology, and a primary care provider.  If you do not hear from [facility] in the next 2-3 business days please use the number on this paperwork to schedule a follow-up appointment.  Please schedule a follow-up appointment with Dr. Alden Hipp.  Please use information on this paperwork to schedule a follow-up appointment with a primary care provider for further management of your care.  Please take Ibuprofen (Advil, motrin) and Tylenol (acetaminophen) to relieve your pain.    You may take up to 600 MG (3 pills) of normal strength ibuprofen every 8 hours as needed.   You make take tylenol, up to 1,000 mg (two extra strength pills) every 8 hours as needed.   It is safe to take ibuprofen and tylenol at the same time as they work differently.   Do not take more than 3,000 mg tylenol in a 24 hour period (not more than one dose every 8 hours.  Please check all medication labels as many medications such as pain and cold medications may contain tylenol.  Do not drink alcohol while taking these medications.  Do not take other NSAID'S while taking ibuprofen (such as aleve or naproxen).  Please take ibuprofen with food to decrease stomach upset.  Get help right away if: You have sudden vision loss. You suddenly get a very bad headache. You have sudden weakness or numbness. You develop droopiness on one side of your face. You suddenly lose the ability to speak, understand speech, or both. You develop difficulty breathing.

## 2021-09-09 ENCOUNTER — Ambulatory Visit: Payer: 59 | Admitting: Neurology

## 2021-09-09 ENCOUNTER — Encounter: Payer: Self-pay | Admitting: Neurology

## 2021-11-25 ENCOUNTER — Emergency Department (HOSPITAL_COMMUNITY)
Admission: EM | Admit: 2021-11-25 | Discharge: 2021-11-25 | Disposition: A | Discharge status: Home / Self Care | Payer: 59 | Attending: Emergency Medicine | Admitting: Emergency Medicine

## 2021-11-25 ENCOUNTER — Other Ambulatory Visit: Payer: Self-pay

## 2021-11-25 ENCOUNTER — Encounter (HOSPITAL_COMMUNITY): Payer: Self-pay | Admitting: Emergency Medicine

## 2021-11-25 DIAGNOSIS — R197 Diarrhea, unspecified: Secondary | ICD-10-CM | POA: Insufficient documentation

## 2021-11-25 DIAGNOSIS — R112 Nausea with vomiting, unspecified: Secondary | ICD-10-CM | POA: Insufficient documentation

## 2021-11-25 NOTE — ED Provider Notes (Signed)
?[facility]-EMERGENCY DEPT ?Provider Note ? ? ?CSN: [id number] ?Arrival date & time: 11/25/21  1613 ? ?  ? ?History ? ?Chief Complaint  ?Patient presents with  ? Needs a work note   ? ? ?Max Gutierrez is a 41 y.o. male. ? ?The history is provided by the patient. No language interpreter was used.  ? ?41 year old male presenting requesting for work note.  Patient states he missed work after having nausea and vomiting after eating some spoiled food.  Several other people has similar symptoms.  He does work.  He felt better today.  Denies any significant abdominal pain.  Tolerates p.o.  Requesting for work note.  No fever chills ? ?Home Medications ?Prior to Admission medications   ?Medication Sig Start Date End Date Taking? Authorizing Provider  ?benzonatate (TESSALON) 100 MG capsule Take 1 capsule (100 mg total) by mouth every 8 (eight) hours. 06/29/15   Mady Gemma, PA-C  ?diphenoxylate-atropine (LOMOTIL) 2.5-0.025 MG per tablet Take 1 tablet by mouth 4 (four) times daily as needed for diarrhea or loose stools. 03/27/13   Rolland Porter, MD  ?methocarbamol (ROBAXIN) 500 MG tablet Take 1 tablet (500 mg total) by mouth 2 (two) times daily. 03/24/21   McDonald, Mia A, PA-C  ?ondansetron (ZOFRAN) 4 MG tablet Take 1 tablet (4 mg total) by mouth every 6 (six) hours. 03/27/13   Rolland Porter, MD  ?   ? ?Allergies    ?Patient has no known allergies.   ? ?Review of Systems   ?Review of Systems  ?All other systems reviewed and are negative. ? ?Physical Exam ?Updated Vital Signs ?BP (!) 175/97 (BP Location: Left Arm)   Pulse (!) 102   Temp 98.8 ?F (37.1 ?C) (Oral)   Resp 16   SpO2 97%  ?Physical Exam ?Vitals and nursing note reviewed.  ?Constitutional:   ?   General: He is not in acute distress. ?   Appearance: He is well-developed.  ?HENT:  ?   Head: Atraumatic.  ?Eyes:  ?   Conjunctiva/sclera: Conjunctivae normal.  ?Abdominal:  ?   Palpations: Abdomen is soft.  ?   Tenderness: There is no abdominal  tenderness.  ?Musculoskeletal:  ?   Cervical back: Neck supple.  ?Skin: ?   Findings: No rash.  ?Neurological:  ?   Mental Status: He is alert.  ? ? ?ED Results / Procedures / Treatments   ?Labs ?(all labs ordered are listed, but only abnormal results are displayed) ?Labs Reviewed - No data to display ? ?EKG ?None ? ?Radiology ?No results found. ? ?Procedures ?Procedures  ? ? ?Medications Ordered in ED ?Medications - No data to display ? ?ED Course/ Medical Decision Making/ A&P ?  ?                        ?Medical Decision Making ? ?BP (!) 175/97 (BP Location: Left Arm)   Pulse (!) 102   Temp 98.8 ?F (37.1 ?C) (Oral)   Resp 16   SpO2 97%  ? ?5:27 PM ?Patient here requesting for work note.  He missed work yesterday after eating spoiled food as nausea vomit diarrhea.  He feels better today.  He mention several other people who ate the same thing and had similar symptoms.  At this time he reported he is back to his normal self.  He does not endorse any abdominal pain.  Vital signs noted for pressure of 175/97.  He will need to  have that blood pressure rechecked by his PCP at his earliest convenience.  He is mildly tachycardic with a heart rate of 102.  Will provide work note per request. ? ? ? ? ? ? ? ?Final Clinical Impression(s) / ED Diagnoses ?Final diagnoses:  ?Nausea vomiting and diarrhea  ? ? ?Rx / DC Orders ?ED Discharge Orders   ? ? None  ? ?  ? ? ?  ?Fayrene Helper, PA-C ?11/25/21 1731 ? ?  ?Mancel Bale, MD ?11/25/21 2259 ? ?

## 2021-11-25 NOTE — ED Notes (Signed)
I provided reinforced discharge education based off of discharge instructions. Pt acknowledged and understood my education. Pt had no further questions/concerns for provider/myself.  °

## 2021-11-25 NOTE — ED Notes (Signed)
Pt ambulatory without assistance.  

## 2021-11-25 NOTE — ED Triage Notes (Signed)
Pt reports needing a work note due to having n/v after eating bad shrimp.  ?

## 2022-11-09 IMAGING — MR MR ORBITS WO/W CM
7 series · 48 of 48 positions shown · IV contrast (10 GADAVIST)
Comparison: Prior brain MRI from earlier the same day.

CLINICAL DATA: Initial evaluation for acute diplopia, pressure
behind eyes.

EXAM:
MRI OF THE ORBITS WITHOUT AND WITH CONTRAST
TECHNIQUE: Multiplanar, multi-echo pulse sequences of the orbits and
surrounding structures were acquired including fat saturation
techniques, before and after intravenous contrast administration.
CONTRAST:  10mL GADAVIST GADOBUTROL 1 MMOL/ML IV SOLN

[Series 6: T1 · sagittal · 5.0mm · 0.81mm/px · 8 of 26 slices shown (1 of 3)]
[im 1/26]
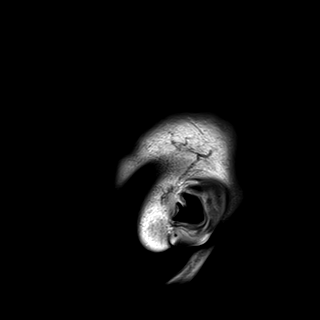
[im 4/26]
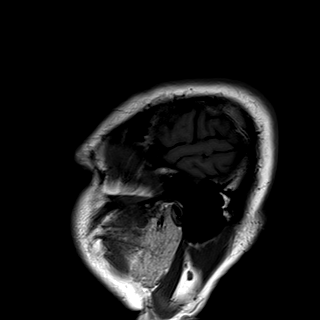
[im 8/26]
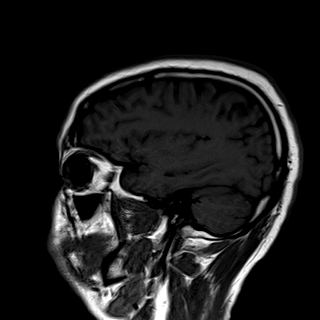
[im 11/26]
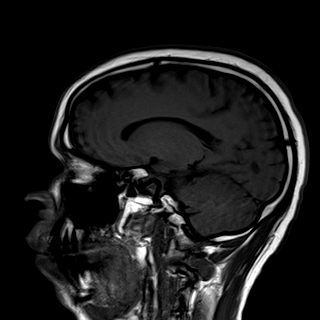
[im 15/26]
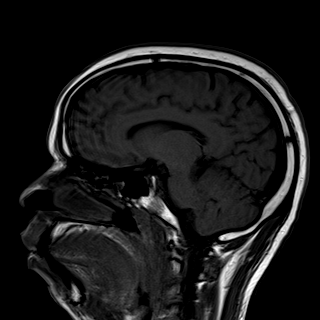
[im 18/26]
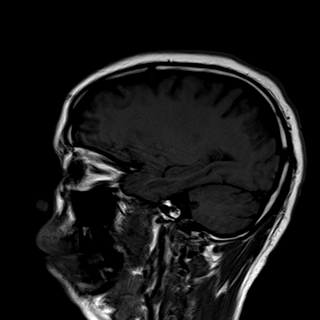
[im 22/26]
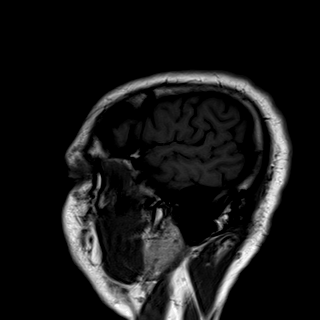
[im 26/26]
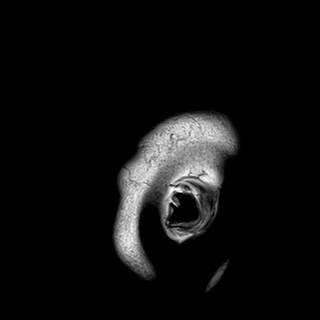

[Series 7: T2 fat-sat · axial · 3.0mm · 0.47mm/px · z∈[-72,-10]mm · 6 of 20 slices shown (1 of 2)]
[im 1/20]
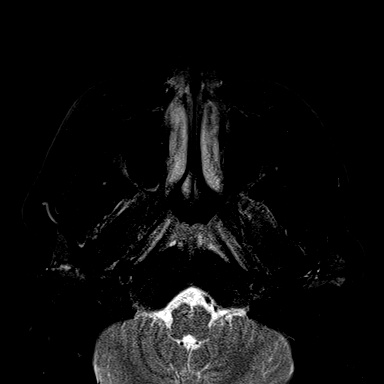
[im 4/20]
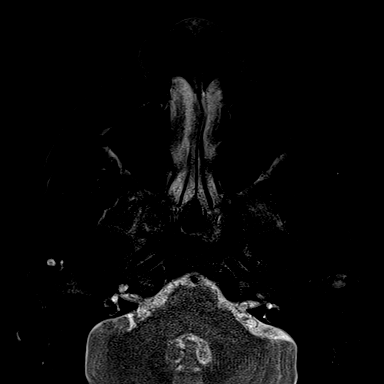
[im 8/20]
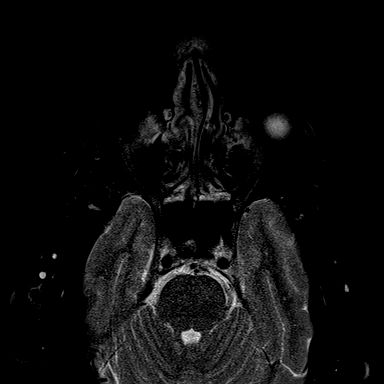
[im 12/20]
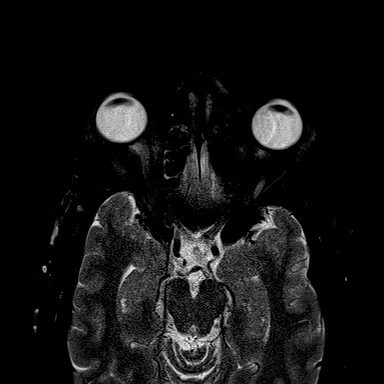
[im 16/20]
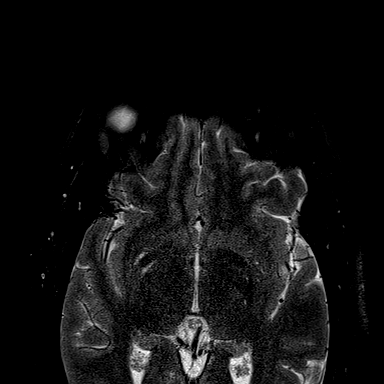
[im 20/20]
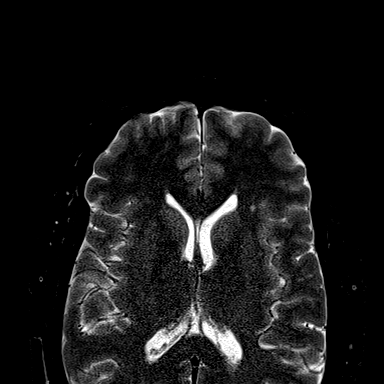

[Series 8: T1 · axial · 3.0mm · 0.70mm/px · z∈[-72,-10]mm · 5 of 20 slices shown (2 of 3)]
[im 1/20]
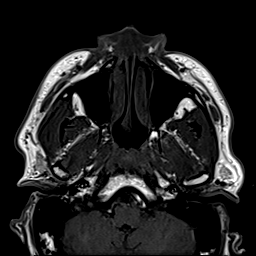
[im 5/20]
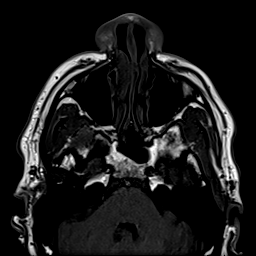
[im 10/20]
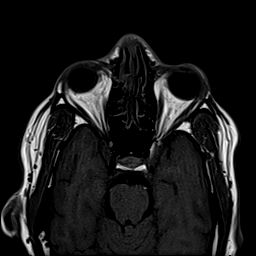
[im 15/20]
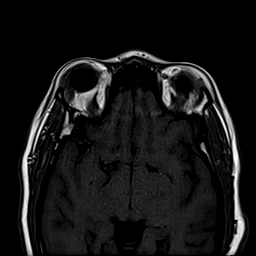
[im 20/20]
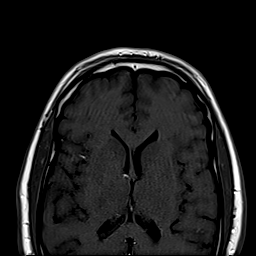

[Series 9: T2 fat-sat · coronal · 3.0mm · 0.56mm/px · 8 of 32 slices shown (2 of 2)]
[im 1/32]
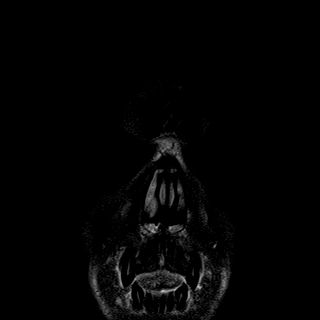
[im 5/32]
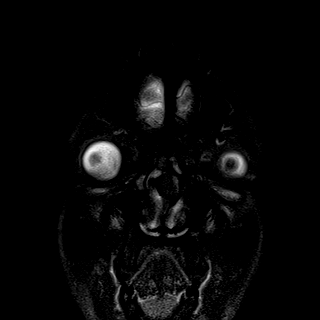
[im 9/32]
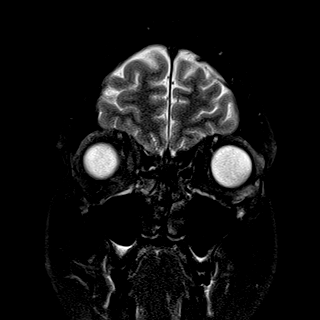
[im 14/32]
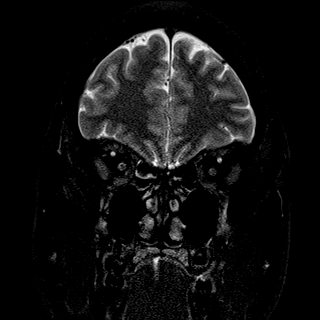
[im 18/32]
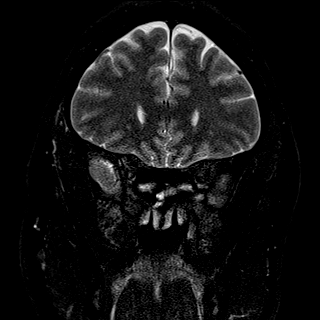
[im 23/32]
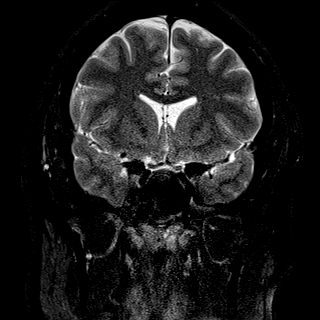
[im 27/32]
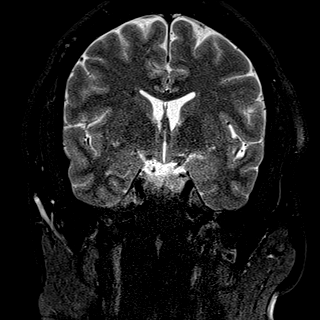
[im 32/32]
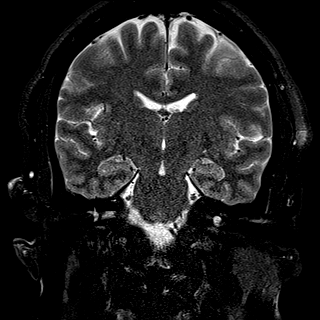

[Series 10: T1 · coronal · 3.0mm · 0.70mm/px · 8 of 32 slices shown (3 of 3)]
[im 1/32]
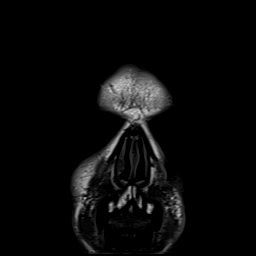
[im 5/32]
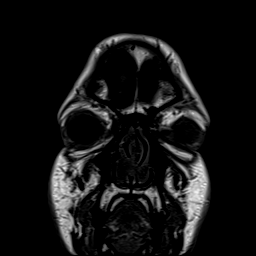
[im 9/32]
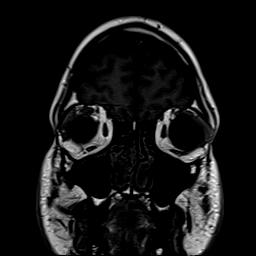
[im 14/32]
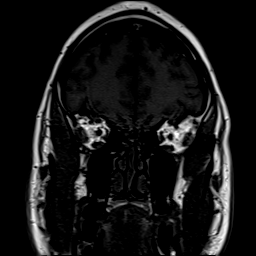
[im 18/32]
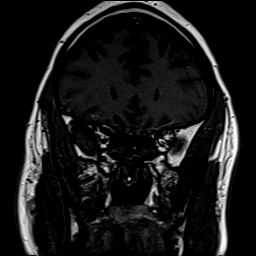
[im 23/32]
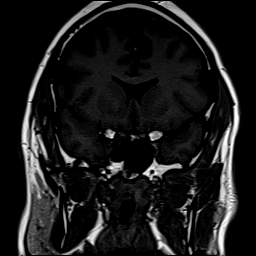
[im 27/32]
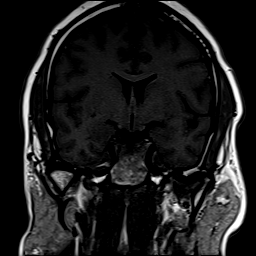
[im 32/32]
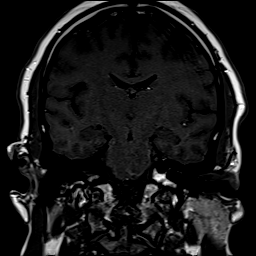

[Series 11: T1 fat-sat post-contrast · axial · 3.0mm · 0.70mm/px · z∈[-72,-10]mm · 5 of 20 slices shown (1 of 2)]
[im 1/20]
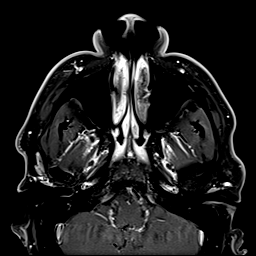
[im 5/20]
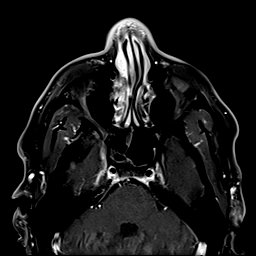
[im 10/20]
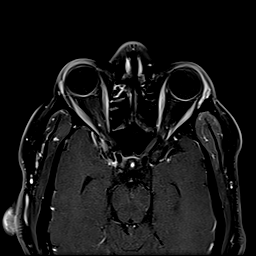
[im 15/20]
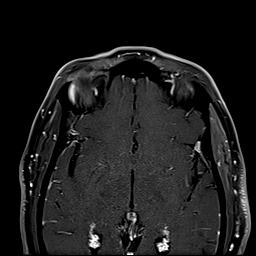
[im 20/20]
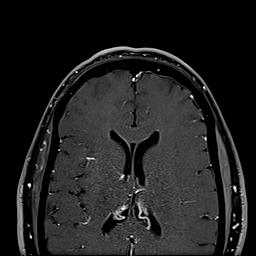

[Series 12: T1 fat-sat post-contrast · coronal · 3.0mm · 0.94mm/px · 8 of 32 slices shown (2 of 2)]
[im 1/32]
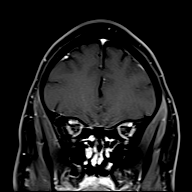
[im 5/32]
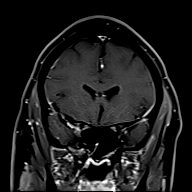
[im 9/32]
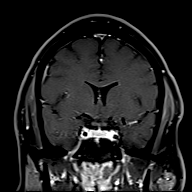
[im 14/32]
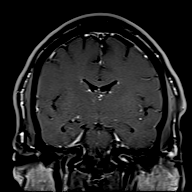
[im 18/32]
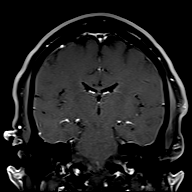
[im 23/32]
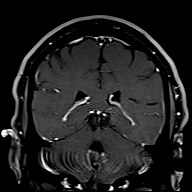
[im 27/32]
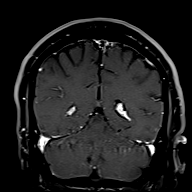
[im 32/32]
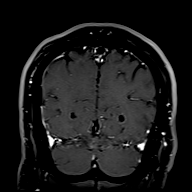

[48 of 48 positions shown; findings below may reference images not displayed]

FINDINGS: Orbits: Examination is somewhat technically limited as the coronal
post contrast sequence is malaligned, incompletely visualized in the
orbital soft tissues.

Globes are symmetric in size with normal appearance and morphology
bilaterally. Optic nerves symmetric and within normal limits. No
visible intrinsic optic nerve edema or enhancement to suggest optic
neuritis. No abnormality seen about either optic nerve sheath
complex. No abnormality about the orbital apices. Optic chiasm
normally situated within the suprasellar cistern.

Extra-ocular muscles symmetric and within normal limits. Intraconal
and extraconal fat well-maintained. Lacrimal glands normal. Superior
orbital veins symmetric and within normal limits. No abnormality
about the cavernous sinus.

Visualized sinuses: Mild scattered mucosal thickening noted within
the sphenoethmoidal sinuses. Visualized paranasal sinuses are
otherwise clear.

Soft tissues: Unremarkable.

Limited intracranial: Better evaluated on prior brain MRI performed
earlier the same day. No visible abnormality.
IMPRESSION: Normal MRI of the orbits. No findings to explain patient's symptoms
identified.

## 2022-11-09 IMAGING — CT CT HEAD W/O CM
3 series · 15 of 47 positions shown, 18 images · non-contrast
Comparison: None.

CLINICAL DATA: Double vision in the right eye, facial droop, right
facial numbness for 2 months



[Series 2: head wo · axial · 0.49mm/px · z∈[-130,+5]mm · 9 of 33 slices shown, 12 images]
[im 3/33  brain]
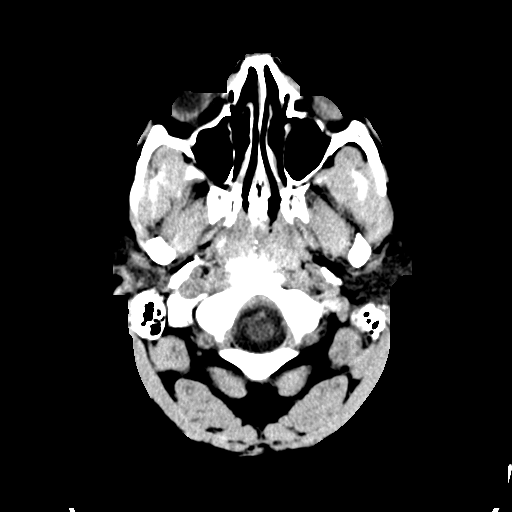
[im 3/33  bone]
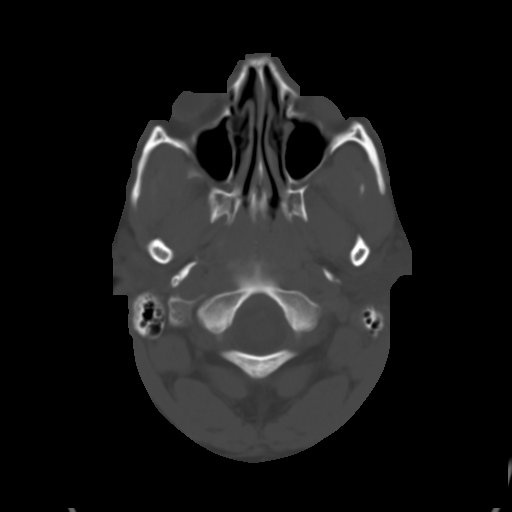
[im 6/33  brain]
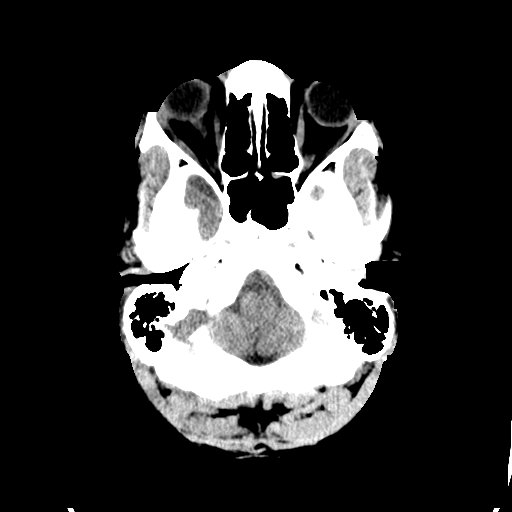
[im 9/33  brain]
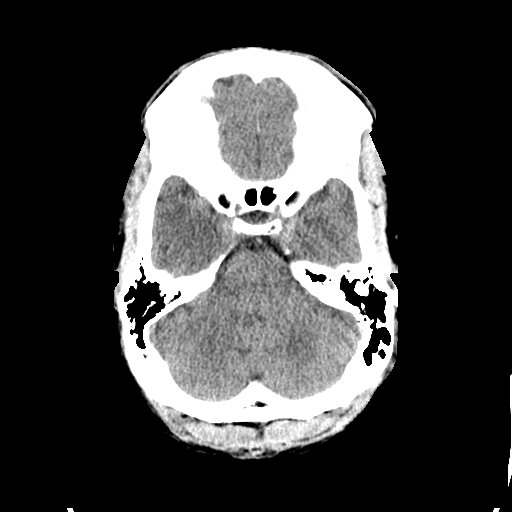
[im 13/33  brain]
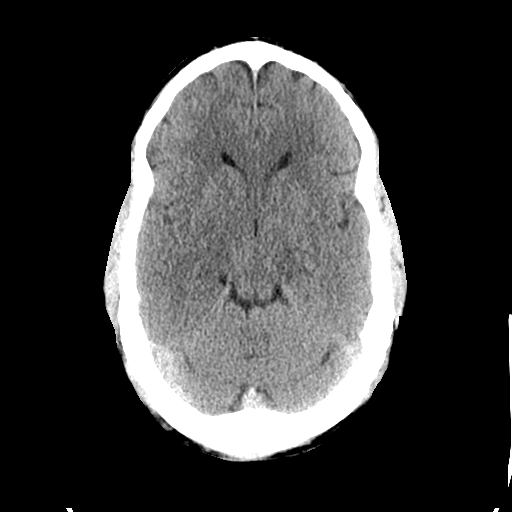
[im 17/33  brain]
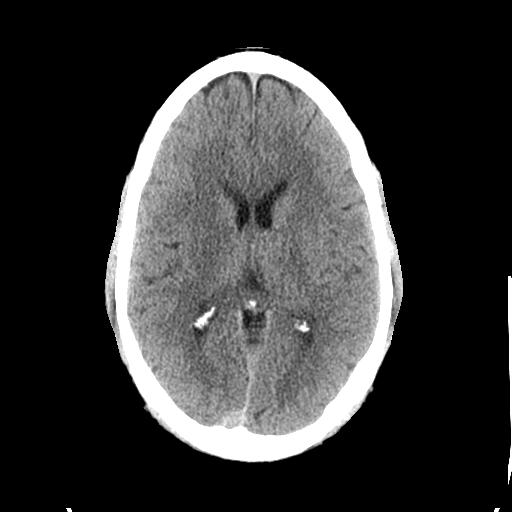
[im 17/33  bone]
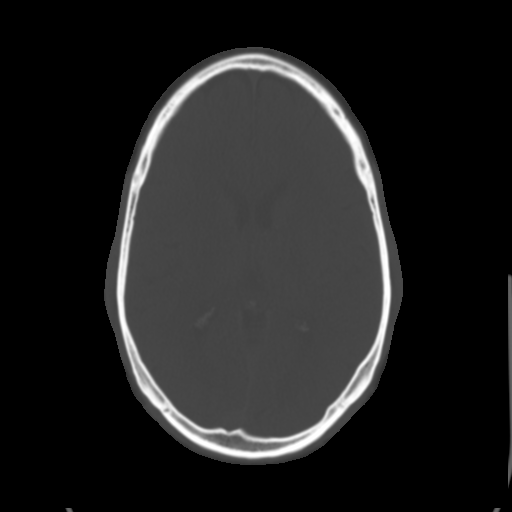
[im 20/33  brain]
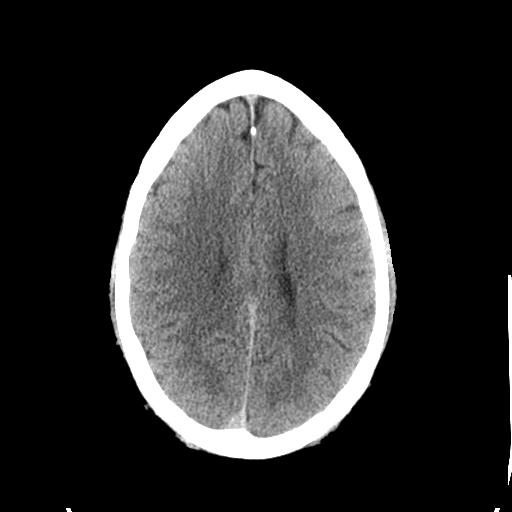
[im 24/33  brain]
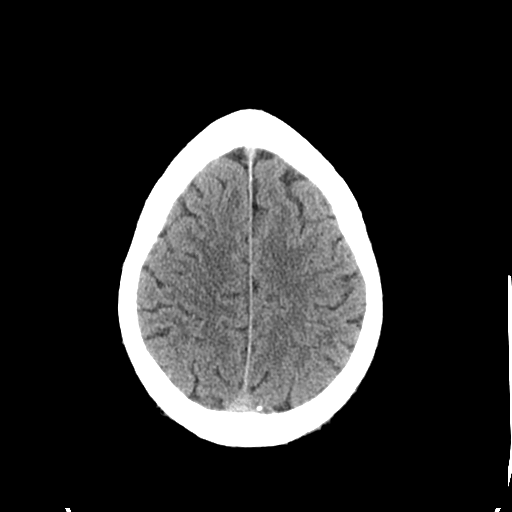
[im 27/33  brain]
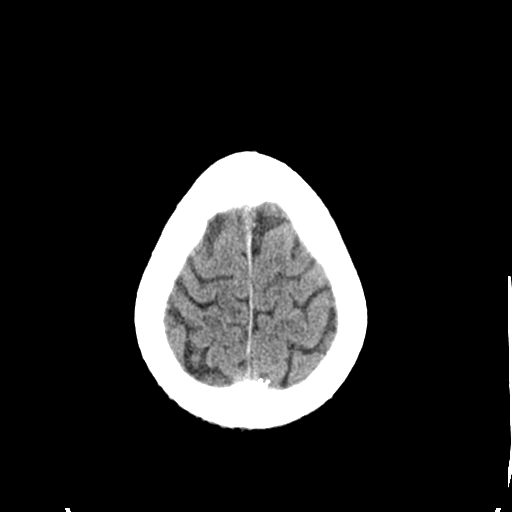
[im 30/33  brain]
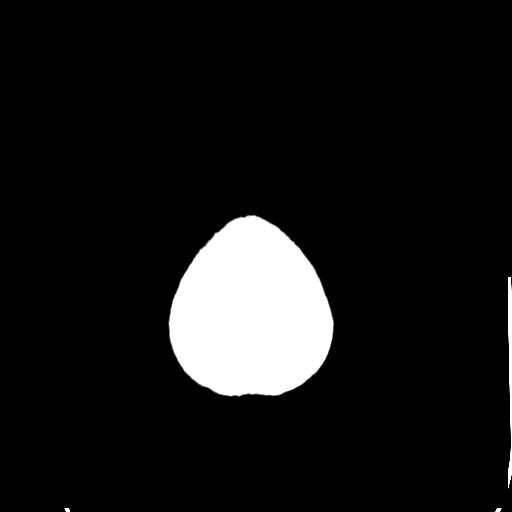
[im 30/33  bone]
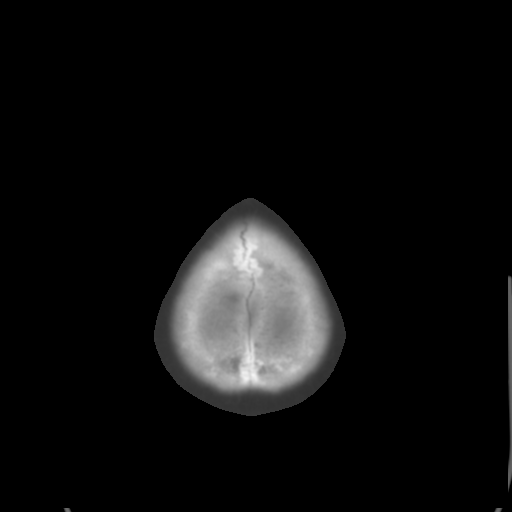

[Series 4: coronal soft tissue · coronal · 0.33mm/px · 3 of 72 slices shown]
[im 24/72  brain]
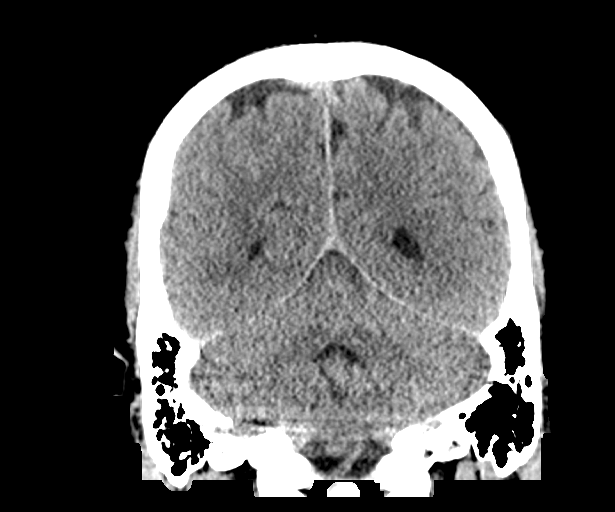
[im 32/72  brain]
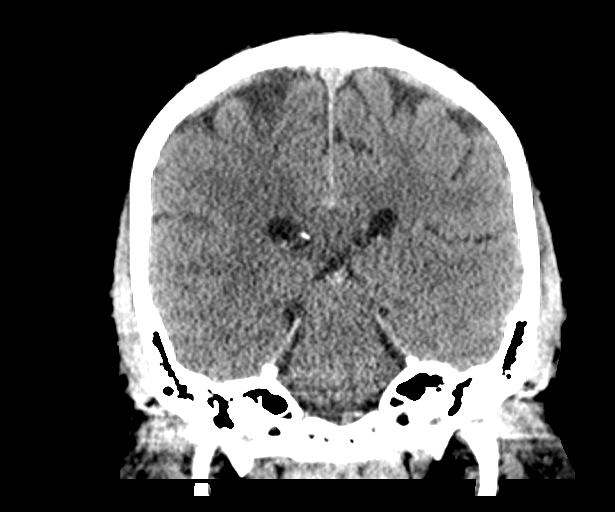
[im 40/72  brain]
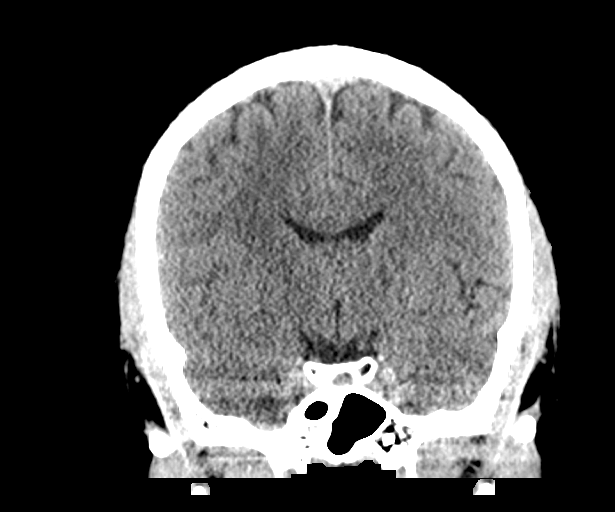

[Series 5: sagittal soft tissue · sagittal · 0.33mm/px · 3 of 56 slices shown]
[im 19/56  brain]
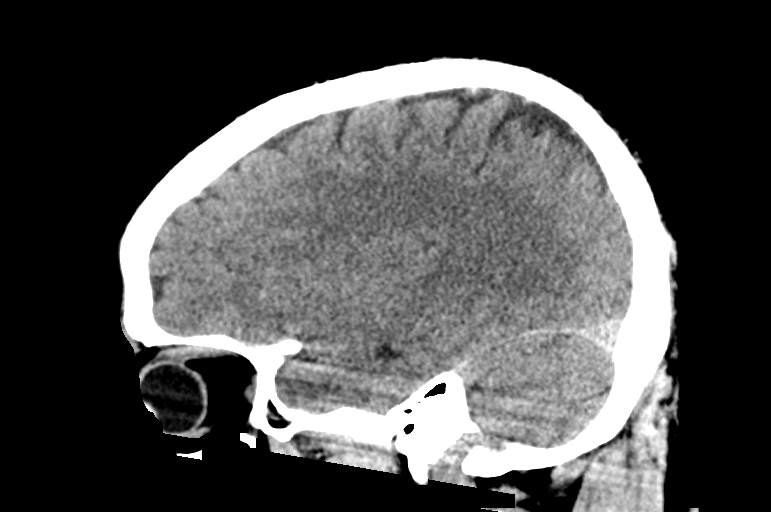
[im 28/56  brain]
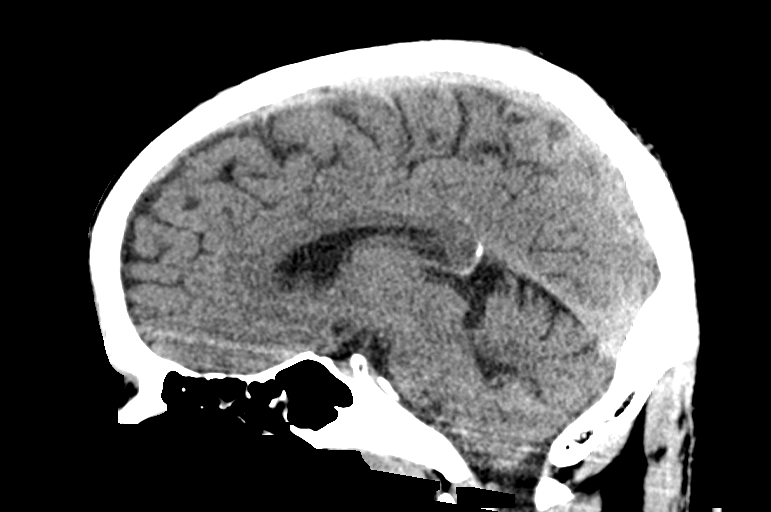
[im 37/56  brain]
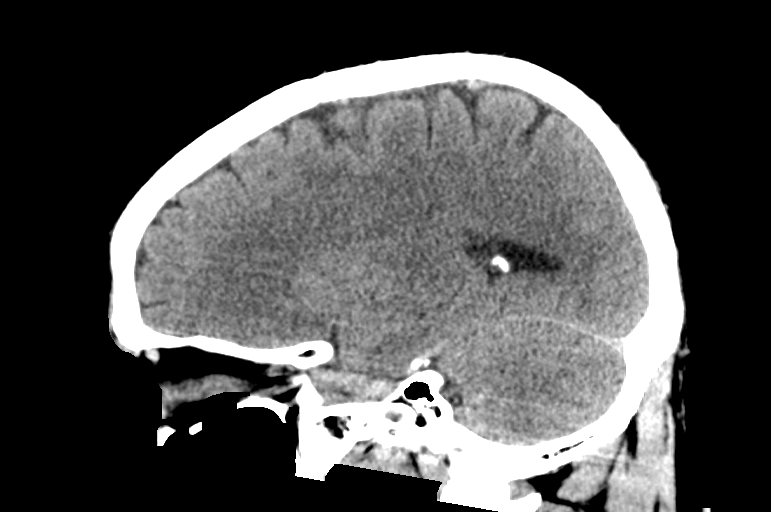

[15 of 47 positions shown; findings below may reference images not displayed]

FINDINGS: Brain: No evidence of acute infarction, hemorrhage, hydrocephalus,
extra-axial collection or mass lesion/mass effect.

Vascular: No hyperdense vessel or unexpected calcification.

Skull: Normal. Negative for fracture or focal lesion.

Sinuses/Orbits: No acute finding.

Other: None.
IMPRESSION: No acute intracranial pathology. No noncontrast CT findings to
explain right sided vision changes.

## 2023-10-20 ENCOUNTER — Other Ambulatory Visit: Payer: Self-pay

## 2023-10-20 ENCOUNTER — Emergency Department (HOSPITAL_COMMUNITY)
Admission: EM | Admit: 2023-10-20 | Discharge: 2023-10-20 | Discharge status: Against Medical Advice | Payer: Self-pay | Attending: Emergency Medicine | Admitting: Emergency Medicine

## 2023-10-20 DIAGNOSIS — Z5321 Procedure and treatment not carried out due to patient leaving prior to being seen by health care provider: Secondary | ICD-10-CM | POA: Insufficient documentation

## 2023-10-20 DIAGNOSIS — R519 Headache, unspecified: Secondary | ICD-10-CM | POA: Insufficient documentation

## 2023-10-20 LAB — RESP PANEL BY RT-PCR (RSV, FLU A&B, COVID)  RVPGX2
Influenza A by PCR: NEGATIVE
Influenza B by PCR: NEGATIVE
Resp Syncytial Virus by PCR: NEGATIVE
SARS Coronavirus 2 by RT PCR: NEGATIVE

## 2023-10-20 NOTE — ED Triage Notes (Signed)
 Pt. Stated, I have a new set of twins at home and I had a headache last night and I want to make sure I dont have a fever, I dont have a temperature around my twins.

## 2023-10-20 NOTE — ED Notes (Signed)
 Pt decided to leave, encouraged pt to stay.

## 2024-04-12 ENCOUNTER — Emergency Department (HOSPITAL_COMMUNITY)
Admission: EM | Admit: 2024-04-12 | Discharge: 2024-04-12 | Disposition: A | Discharge status: Home / Self Care | Payer: Self-pay | Attending: Emergency Medicine | Admitting: Emergency Medicine

## 2024-04-12 ENCOUNTER — Other Ambulatory Visit: Payer: Self-pay

## 2024-04-12 DIAGNOSIS — R197 Diarrhea, unspecified: Secondary | ICD-10-CM | POA: Insufficient documentation

## 2024-04-12 NOTE — Discharge Instructions (Signed)
 You were seen here today in the emergency department.  Please try some bland foods such as bananas, rice, applesauce toast.  You take Tums as needed.  Please make sure you are staying well-hydrated drinking plenty of fluids, mainly water.  If start having abdominal pain, nausea, vomiting, fever, black or bloody bowel movements, not passing any gas, please return your nearest emerged from for reevaluation.  If you have any other concerns, new or worsening symptoms, please return to your nearest for department for evaluation.  Contact a doctor if: You have a fever. Your diarrhea gets worse. You have new symptoms. You vomit every time you eat or drink. You feel light-headed, dizzy, or you have a headache. You have muscle cramps. You have signs of losing too much water in your body, such as: Dark pee, very little pee, or no pee. Cracked lips. Dry mouth. Sunken eyes. Sleepiness. Weakness. You have bloody or black poop or poop that looks like tar. You have very bad pain, cramping, or bloating in your belly (abdomen). Your skin feels cold and clammy. You feel confused. Get help right away if: You have chest pain. Your heart is beating very quickly. You have trouble breathing or you are breathing very quickly. You feel very weak or you faint. These symptoms may be an emergency. Get help right away. Call 911. Do not wait to see if the symptoms will go away. Do not drive yourself to the hospital.

## 2024-04-12 NOTE — ED Triage Notes (Signed)
 Pt here from home with c/o abd n/v after eating some seafood yesterday

## 2024-04-12 NOTE — ED Provider Notes (Signed)
  EMERGENCY DEPARTMENT AT [facility] Provider Note   CSN: [id number] Arrival date & time: 04/12/24  1208     Patient presents with: Abdominal Pain and Emesis   Max Gutierrez is a 43 y.o. male self reportedly otherwise healthy presents emerged from today for evaluation of work note.  Patient reports that he did miss work today because he had had 2 episodes of diarrhea earlier.  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  He reports he needed a work note saying that he was seen by a physician today.  He reports he has not had any diarrhea since.  Denies any belly pain.  No nausea or vomiting.  Denies any urinary symptoms.  Denies any fevers.  No black or bloody stools.  Reports he did have some shrimp last night but was cooked.  He feels like this is what is causing his symptoms.  Feels at his baseline of health now.  Would like to return to work tomorrow.  I reviewed nursing note however patient adamantly denies he did not have any nausea or vomiting or abdominal pain.    Abdominal Pain Associated symptoms: diarrhea   Associated symptoms: no chills, no constipation, no dysuria, no fever, no hematuria, no nausea and no vomiting   Emesis Associated symptoms: diarrhea   Associated symptoms: no abdominal pain, no chills and no fever        Prior to Admission medications   Medication Sig Start Date End Date Taking? Authorizing Provider  benzonatate  (TESSALON ) 100 MG capsule Take 1 capsule (100 mg total) by mouth every 8 (eight) hours. 06/29/15   Veronica Almarie BROCKS, PA-C  diphenoxylate -atropine  (LOMOTIL ) 2.5-0.025 MG per tablet Take 1 tablet by mouth 4 (four) times daily as needed for diarrhea or loose stools. 03/27/13   Lynwood Anes, MD  methocarbamol  (ROBAXIN ) 500 MG tablet Take 1 tablet (500 mg total) by mouth 2 (two) times daily. 03/24/21   McDonald, Mia A, PA-C  ondansetron  (ZOFRAN ) 4 MG tablet Take 1 tablet (4 mg total) by mouth  every 6 (six) hours. 03/27/13   Lynwood Anes, MD    Allergies: Patient has no known allergies.    Review of Systems  Constitutional:  Negative for chills and fever.  Gastrointestinal:  Positive for diarrhea. Negative for abdominal pain, constipation, nausea and vomiting.  Genitourinary:  Negative for dysuria and hematuria.    Updated Vital Signs BP (!) 152/89 (BP Location: Left Arm)   Pulse 78   Temp 99 F (37.2 C)   Resp 18   Ht 6' 1 (1.854 m)   Wt 101.2 kg   SpO2 94%   BMI 29.44 kg/m   Physical Exam Vitals and nursing note reviewed.  Constitutional:      General: He is not in acute distress.    Appearance: He is not ill-appearing or toxic-appearing.  Eyes:     General: No scleral icterus. Cardiovascular:     Rate and Rhythm: Normal rate.  Pulmonary:     Effort: Pulmonary effort is normal. No respiratory distress.  Abdominal:     General: There is no distension.     Palpations: Abdomen is soft.     Tenderness: There is no abdominal tenderness. There is no guarding or rebound.  Skin:    General: Skin is warm and dry.  Neurological:     Mental Status: He is alert.     (all labs ordered are listed, but only  abnormal results are displayed) Labs Reviewed - No data to display  EKG: None  Radiology: No results found.  Procedures   Medications Ordered in the ED - No data to display                              Medical Decision Making  43 y.o. male presents to the ER today for evaluation of diarrhea, requesting work note. Differential diagnosis includes but is not limited to Infectious diarrhea, GI Bleed, Appendicitis, Mesenteric Ischemia, Diverticulitis, endocrine causes (adrenal, thyroid), IBD. Vital signs mildly elevated blood pressure otherwise unremarkable. Physical exam as noted above.   Patient reports he had 2 episodes of diarrhea after having some shrimp.  Has not had any since.  Never had any abdominal pain, nausea, vomiting.  Feels at his baseline of  health.  Reports he came or just for a work note and would like to return to work tomorrow.  Denies any urinary symptoms.  No raw fish consumption.  He has mildly elevated blood pressure here but not tachycardic or febrile.  His abdomen is soft and nontender.  Patient reports he does not feel comfortable into work today given he is a Scientist, forensic.  He has no other complaints.  His exam is benign.  Discussed with him about staying well-hydrated before returning to work tomorrow.  Discussed return precautions.  Given patient does not have any other symptoms and only had 2 episodes of nonbloody, nonmelenic stool, do feel he can be discharged home. I do not feel that the patient needs additional workup with labs or imaging. Supportive care measures and return precautions given.   We discussed plan at bedside. We discussed strict return precautions and red flag symptoms. The patient verbalized their understanding and agrees to the plan. The patient is stable and being discharged home in good condition.  Portions of this report may have been transcribed using voice recognition software. Every effort was made to ensure accuracy; however, inadvertent computerized transcription errors may be present.    Final diagnoses:  Diarrhea, unspecified type    ED Discharge Orders     None          Bernis Ernst, DEVONNA 04/12/24 2212    Dasie Faden, MD 04/13/24 [phone]
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
## 2024-05-13 NOTE — ED Notes (Signed)
 EKG given to MD Jakie

## 2024-05-13 NOTE — ED Notes (Signed)
 Assumed care at this time. Introduced myself to pt. Patient is A&Ox4. Pt resting in bed, respirations are even and unlabored. VSS except b/p elevated, NAD. Patient complained of having chest pain rated at 9/10 pai scale. MD made aware. Patient was medicated as ordered. No other needs expressed at this time.

## 2024-05-13 NOTE — ED Notes (Signed)
 EKG (my reading) 05/13/2024 18:40:59  Rhythm: Sinus Bradycardia, NOS Axis: Normal QRS: Normal QRS ST: Normal ST T waves: Normal T waves Comparison:No comparison available  Impression:  No STEMI
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
## 2024-05-14 ENCOUNTER — Emergency Department (HOSPITAL_COMMUNITY)
Admission: EM | Admit: 2024-05-14 | Discharge: 2024-05-14 | Disposition: A | Discharge status: Home / Self Care | Payer: Self-pay | Attending: Emergency Medicine | Admitting: Emergency Medicine

## 2024-05-14 ENCOUNTER — Emergency Department (HOSPITAL_COMMUNITY): Payer: Self-pay

## 2024-05-14 ENCOUNTER — Encounter (HOSPITAL_COMMUNITY): Payer: Self-pay

## 2024-05-14 DIAGNOSIS — F17210 Nicotine dependence, cigarettes, uncomplicated: Secondary | ICD-10-CM | POA: Insufficient documentation

## 2024-05-14 DIAGNOSIS — Y249XXD Unspecified firearm discharge, undetermined intent, subsequent encounter: Secondary | ICD-10-CM | POA: Insufficient documentation

## 2024-05-14 DIAGNOSIS — S21139D Puncture wound without foreign body of unspecified front wall of thorax without penetration into thoracic cavity, subsequent encounter: Secondary | ICD-10-CM | POA: Insufficient documentation

## 2024-05-14 HISTORY — DX: Unspecified firearm discharge, undetermined intent, initial encounter: Y24.9XXA

## 2024-05-14 LAB — CBC
HCT: 43.5 % (ref 39.0–52.0)
Hemoglobin: 14.2 g/dL (ref 13.0–17.0)
MCH: 31 pg (ref 26.0–34.0)
MCHC: 32.6 g/dL (ref 30.0–36.0)
MCV: 95 fL (ref 80.0–100.0)
Platelets: 286 K/uL (ref 150–400)
RBC: 4.58 MIL/uL (ref 4.22–5.81)
RDW: 12.8 % (ref 11.5–15.5)
WBC: 8.9 K/uL (ref 4.0–10.5)
nRBC: 0 % (ref 0.0–0.2)

## 2024-05-14 LAB — COMPREHENSIVE METABOLIC PANEL WITH GFR
ALT: 33 U/L (ref 0–44)
AST: 34 U/L (ref 15–41)
Albumin: 4.5 g/dL (ref 3.5–5.0)
Alkaline Phosphatase: 85 U/L (ref 38–126)
Anion gap: 13 (ref 5–15)
BUN: 11 mg/dL (ref 6–20)
CO2: 20 mmol/L — ABNORMAL LOW (ref 22–32)
Calcium: 9.6 mg/dL (ref 8.9–10.3)
Chloride: 101 mmol/L (ref 98–111)
Creatinine, Ser: 0.8 mg/dL (ref 0.61–1.24)
GFR, Estimated: >60 mL/min (ref >60)
Glucose, Bld: 118 mg/dL — ABNORMAL HIGH (ref 70–99)
Potassium: 4.3 mmol/L (ref 3.5–5.1)
Sodium: 134 mmol/L — ABNORMAL LOW (ref 135–145)
Total Bilirubin: 0.8 mg/dL (ref 0.0–1.2)
Total Protein: 7.5 g/dL (ref 6.5–8.1)

## 2024-05-14 MED ORDER — HYDROMORPHONE HCL 1 MG/ML IJ SOLN
1.0000 mg | Freq: Once | INTRAMUSCULAR | Status: AC
  Administered 2024-05-14: 1 mg via INTRAVENOUS
  Filled 2024-05-14: qty 1

## 2024-05-14 NOTE — Discharge Instructions (Addendum)
 You were evaluated in the Emergency Department and after careful evaluation, we did not find any emergent condition requiring admission or further testing in the hospital.  Your exam/testing today is overall reassuring.  Fill your prescriptions as we discussed, plenty fluids and rest.  Recommend follow-up with [place] surgery, call the number provided.  Please return to the Emergency Department if you experience any worsening of your condition.   Thank you for allowing us  to be a part of your care.
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
## 2024-05-23 ENCOUNTER — Emergency Department (HOSPITAL_COMMUNITY)
Admission: EM | Admit: 2024-05-23 | Discharge: 2024-05-23 | Disposition: A | Discharge status: Home / Self Care | Payer: Self-pay | Attending: Emergency Medicine | Admitting: Emergency Medicine

## 2024-05-23 ENCOUNTER — Emergency Department (HOSPITAL_COMMUNITY): Payer: Self-pay

## 2024-05-23 DIAGNOSIS — R0789 Other chest pain: Secondary | ICD-10-CM | POA: Insufficient documentation

## 2024-05-23 DIAGNOSIS — Z720 Tobacco use: Secondary | ICD-10-CM | POA: Insufficient documentation

## 2024-05-23 LAB — BASIC METABOLIC PANEL WITH GFR
Anion gap: 13 (ref 5–15)
BUN: 10 mg/dL (ref 6–20)
CO2: 22 mmol/L (ref 22–32)
Calcium: 9.8 mg/dL (ref 8.9–10.3)
Chloride: 103 mmol/L (ref 98–111)
Creatinine, Ser: 0.98 mg/dL (ref 0.61–1.24)
GFR, Estimated: >60 mL/min (ref >60)
Glucose, Bld: 160 mg/dL — ABNORMAL HIGH (ref 70–99)
Potassium: 3.9 mmol/L (ref 3.5–5.1)
Sodium: 137 mmol/L (ref 135–145)

## 2024-05-23 LAB — TROPONIN T, HIGH SENSITIVITY: Troponin T High Sensitivity: <15 ng/L (ref 0–19)

## 2024-05-23 LAB — CBC
HCT: 43.8 % (ref 39.0–52.0)
Hemoglobin: 14.8 g/dL (ref 13.0–17.0)
MCH: 31.8 pg (ref 26.0–34.0)
MCHC: 33.8 g/dL (ref 30.0–36.0)
MCV: 94.2 fL (ref 80.0–100.0)
Platelets: 301 K/uL (ref 150–400)
RBC: 4.65 MIL/uL (ref 4.22–5.81)
RDW: 13.2 % (ref 11.5–15.5)
WBC: 6.8 K/uL (ref 4.0–10.5)
nRBC: 0 % (ref 0.0–0.2)
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
